# Patient Record
Sex: Male | Born: 1935 | Race: White | Hispanic: No | Marital: Married | Smoking: Former smoker
Health system: Southern US, Community
[De-identification: ages and names within clinical notes are randomized; demographics above are authoritative.]

## PROBLEM LIST (undated history)

## (undated) DIAGNOSIS — I10 Essential (primary) hypertension: Secondary | ICD-10-CM

## (undated) DIAGNOSIS — I4891 Unspecified atrial fibrillation: Secondary | ICD-10-CM

## (undated) DIAGNOSIS — M1990 Unspecified osteoarthritis, unspecified site: Secondary | ICD-10-CM

## (undated) DIAGNOSIS — K635 Polyp of colon: Secondary | ICD-10-CM

## (undated) DIAGNOSIS — K219 Gastro-esophageal reflux disease without esophagitis: Secondary | ICD-10-CM

## (undated) DIAGNOSIS — I6529 Occlusion and stenosis of unspecified carotid artery: Secondary | ICD-10-CM

## (undated) DIAGNOSIS — N200 Calculus of kidney: Secondary | ICD-10-CM

## (undated) DIAGNOSIS — E785 Hyperlipidemia, unspecified: Secondary | ICD-10-CM

## (undated) DIAGNOSIS — K5733 Diverticulitis of large intestine without perforation or abscess with bleeding: Secondary | ICD-10-CM

## (undated) HISTORY — DX: Diverticulitis of large intestine without perforation or abscess with bleeding: K57.33

## (undated) HISTORY — PX: HERNIA REPAIR: SHX51

## (undated) HISTORY — PX: SHOULDER SURGERY: SHX246

## (undated) HISTORY — DX: Unspecified osteoarthritis, unspecified site: M19.90

## (undated) HISTORY — DX: Essential (primary) hypertension: I10

## (undated) HISTORY — PX: KIDNEY STONE SURGERY: SHX686

## (undated) HISTORY — DX: Calculus of kidney: N20.0

## (undated) HISTORY — PX: CAROTID ENDARTERECTOMY: SUR193

## (undated) HISTORY — DX: Polyp of colon: K63.5

## (undated) HISTORY — PX: APPENDECTOMY: SHX54

## (undated) HISTORY — PX: FETAL SURGERY FOR CONGENITAL HERNIA: SHX1618

## (undated) HISTORY — DX: Occlusion and stenosis of unspecified carotid artery: I65.29

## (undated) HISTORY — DX: Gastro-esophageal reflux disease without esophagitis: K21.9

## (undated) HISTORY — DX: Hyperlipidemia, unspecified: E78.5

## (undated) HISTORY — DX: Unspecified atrial fibrillation: I48.91

## (undated) HISTORY — PX: PACEMAKER INSERTION: SHX728

## (undated) HISTORY — PX: CATARACT EXTRACTION: SUR2

---

## 1997-05-24 ENCOUNTER — Encounter: Payer: Self-pay | Admitting: Gastroenterology

## 2001-01-03 ENCOUNTER — Encounter: Payer: Self-pay | Admitting: Gastroenterology

## 2004-12-28 ENCOUNTER — Encounter: Payer: Self-pay | Admitting: Gastroenterology

## 2005-12-24 HISTORY — PX: OTHER SURGICAL HISTORY: SHX169

## 2007-01-22 ENCOUNTER — Inpatient Hospital Stay (HOSPITAL_COMMUNITY): Admission: EM | Admit: 2007-01-22 | Discharge: 2007-01-25 | Payer: Self-pay | Admitting: Emergency Medicine

## 2007-01-22 ENCOUNTER — Encounter (INDEPENDENT_AMBULATORY_CARE_PROVIDER_SITE_OTHER): Payer: Self-pay | Admitting: *Deleted

## 2007-01-25 ENCOUNTER — Encounter (INDEPENDENT_AMBULATORY_CARE_PROVIDER_SITE_OTHER): Payer: Self-pay | Admitting: *Deleted

## 2007-01-27 ENCOUNTER — Ambulatory Visit: Payer: Self-pay | Admitting: Gastroenterology

## 2007-02-13 ENCOUNTER — Ambulatory Visit: Payer: Self-pay | Admitting: Cardiovascular Disease

## 2007-03-03 ENCOUNTER — Ambulatory Visit: Payer: Self-pay | Admitting: Internal Medicine

## 2007-03-03 LAB — CONVERTED CEMR LAB
BUN: 22 mg/dL (ref 6–23)
Basophils Absolute: 0 10*3/uL (ref 0.0–0.1)
Calcium: 8.9 mg/dL (ref 8.4–10.5)
Chloride: 109 meq/L (ref 96–112)
Creatinine, Ser: 1.4 mg/dL (ref 0.4–1.5)
Eosinophils Absolute: 0.1 10*3/uL (ref 0.0–0.6)
GFR calc non Af Amer: 52 mL/min
HCT: 35.1 % — ABNORMAL LOW (ref 39.0–52.0)
INR: 0.9 (ref 0.9–2.0)
MCHC: 33.7 g/dL (ref 30.0–36.0)
MCV: 97.8 fL (ref 78.0–100.0)
Monocytes Relative: 12.4 % — ABNORMAL HIGH (ref 3.0–11.0)
Platelets: 180 10*3/uL (ref 150–400)
RBC: 3.59 M/uL — ABNORMAL LOW (ref 4.22–5.81)
RDW: 13.4 % (ref 11.5–14.6)
Sodium: 143 meq/L (ref 135–145)
aPTT: 33 s (ref 26.5–36.5)

## 2007-03-12 ENCOUNTER — Ambulatory Visit: Payer: Self-pay | Admitting: Gastroenterology

## 2007-03-12 LAB — CONVERTED CEMR LAB: INR: 3.2 — ABNORMAL HIGH (ref 0.9–2.0)

## 2007-04-15 ENCOUNTER — Ambulatory Visit: Payer: Self-pay | Admitting: Internal Medicine

## 2007-04-15 ENCOUNTER — Ambulatory Visit: Payer: Self-pay | Admitting: Cardiology

## 2007-04-15 LAB — CONVERTED CEMR LAB
BUN: 15 mg/dL (ref 6–23)
Basophils Absolute: 0.1 10*3/uL (ref 0.0–0.1)
Basophils Relative: 1.3 % — ABNORMAL HIGH (ref 0.0–1.0)
Calcium: 8.8 mg/dL (ref 8.4–10.5)
Creatinine, Ser: 1.3 mg/dL (ref 0.4–1.5)
GFR calc Af Amer: 68 mL/min
Hemoglobin: 12.2 g/dL — ABNORMAL LOW (ref 13.0–17.0)
INR: 2.5 — ABNORMAL HIGH (ref 0.9–2.0)
MCHC: 34.8 g/dL (ref 30.0–36.0)
Monocytes Absolute: 0.5 10*3/uL (ref 0.2–0.7)
Monocytes Relative: 10.5 % (ref 3.0–11.0)
Neutro Abs: 3.1 10*3/uL (ref 1.4–7.7)
Platelets: 167 10*3/uL (ref 150–400)
Potassium: 4.1 meq/L (ref 3.5–5.1)
Prothrombin Time: 19.9 s — ABNORMAL HIGH (ref 10.0–14.0)
RDW: 12.8 % (ref 11.5–14.6)
aPTT: 36.6 s — ABNORMAL HIGH (ref 26.5–36.5)

## 2007-04-17 ENCOUNTER — Ambulatory Visit: Payer: Self-pay | Admitting: Internal Medicine

## 2007-04-17 ENCOUNTER — Inpatient Hospital Stay (HOSPITAL_COMMUNITY): Admission: RE | Admit: 2007-04-17 | Discharge: 2007-04-18 | Payer: Self-pay | Admitting: Internal Medicine

## 2007-04-24 ENCOUNTER — Ambulatory Visit: Payer: Self-pay | Admitting: Cardiovascular Disease

## 2007-04-29 ENCOUNTER — Ambulatory Visit: Payer: Self-pay | Admitting: Internal Medicine

## 2007-04-30 ENCOUNTER — Ambulatory Visit: Payer: Self-pay | Admitting: *Deleted

## 2007-05-05 ENCOUNTER — Ambulatory Visit: Payer: Self-pay | Admitting: Cardiovascular Disease

## 2007-05-07 ENCOUNTER — Ambulatory Visit: Payer: Self-pay | Admitting: Internal Medicine

## 2007-06-02 ENCOUNTER — Ambulatory Visit: Payer: Self-pay | Admitting: Internal Medicine

## 2007-06-19 ENCOUNTER — Ambulatory Visit: Payer: Self-pay | Admitting: Cardiology

## 2007-06-19 ENCOUNTER — Ambulatory Visit: Payer: Self-pay | Admitting: Internal Medicine

## 2007-06-30 ENCOUNTER — Ambulatory Visit: Payer: Self-pay | Admitting: Cardiovascular Disease

## 2007-08-15 ENCOUNTER — Ambulatory Visit: Payer: Self-pay | Admitting: Cardiovascular Disease

## 2008-04-07 ENCOUNTER — Encounter: Admission: RE | Admit: 2008-04-07 | Discharge: 2008-04-07 | Payer: Self-pay | Admitting: Neurosurgery

## 2008-04-12 ENCOUNTER — Ambulatory Visit: Payer: Self-pay | Admitting: Cardiovascular Disease

## 2008-04-12 ENCOUNTER — Inpatient Hospital Stay (HOSPITAL_COMMUNITY): Admission: EM | Admit: 2008-04-12 | Discharge: 2008-04-21 | Payer: Self-pay | Admitting: Emergency Medicine

## 2008-04-20 ENCOUNTER — Encounter (INDEPENDENT_AMBULATORY_CARE_PROVIDER_SITE_OTHER): Payer: Self-pay | Admitting: Interventional Radiology

## 2008-05-04 ENCOUNTER — Encounter: Payer: Self-pay | Admitting: Interventional Radiology

## 2008-05-05 ENCOUNTER — Encounter: Admission: RE | Admit: 2008-05-05 | Discharge: 2008-05-05 | Payer: Self-pay | Admitting: Neurosurgery

## 2008-05-10 ENCOUNTER — Ambulatory Visit: Payer: Self-pay | Admitting: Internal Medicine

## 2008-05-18 ENCOUNTER — Encounter: Admission: RE | Admit: 2008-05-18 | Discharge: 2008-05-18 | Payer: Self-pay | Admitting: Neurosurgery

## 2008-05-27 ENCOUNTER — Ambulatory Visit (HOSPITAL_COMMUNITY): Admission: RE | Admit: 2008-05-27 | Discharge: 2008-05-27 | Payer: Self-pay | Admitting: Neurosurgery

## 2008-05-27 ENCOUNTER — Encounter (INDEPENDENT_AMBULATORY_CARE_PROVIDER_SITE_OTHER): Payer: Self-pay | Admitting: Interventional Radiology

## 2008-06-10 ENCOUNTER — Encounter: Admission: RE | Admit: 2008-06-10 | Discharge: 2008-06-10 | Payer: Self-pay | Admitting: Neurosurgery

## 2008-09-02 IMAGING — CR DG THORACIC SPINE 2V
3 series · 3 of 3 positions shown · non-contrast
Comparison: CT T spine of 04/17/2008

CLINICAL DATA: Kyphoplasty 04/25/2008, still having midline
thoracic pain

THORACIC SPINE - 2 VIEW

[t t-spine a.p.]
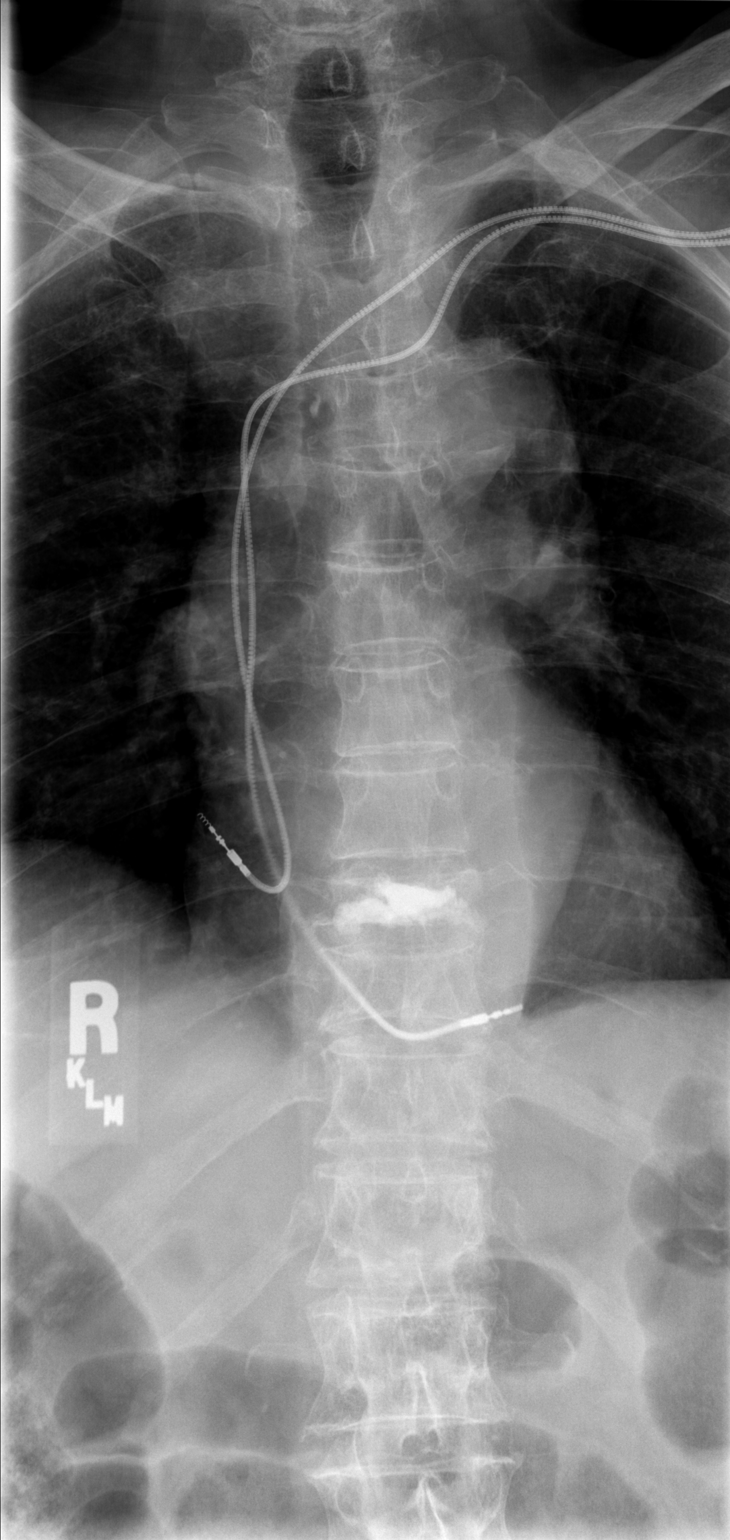

[t t-spine lat]
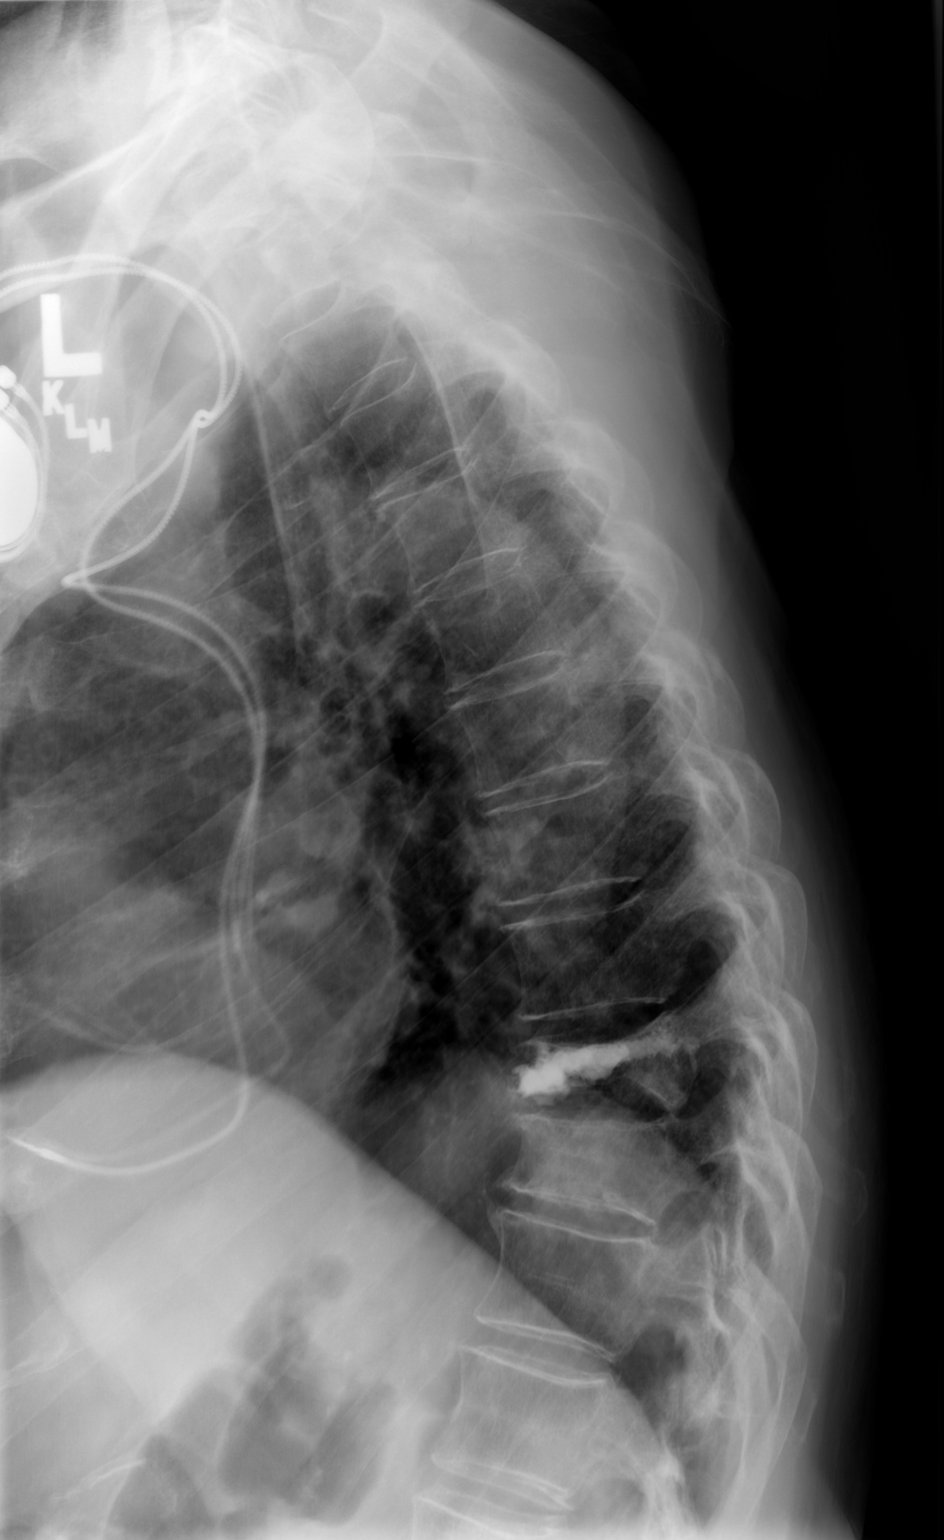

[t swimmers]
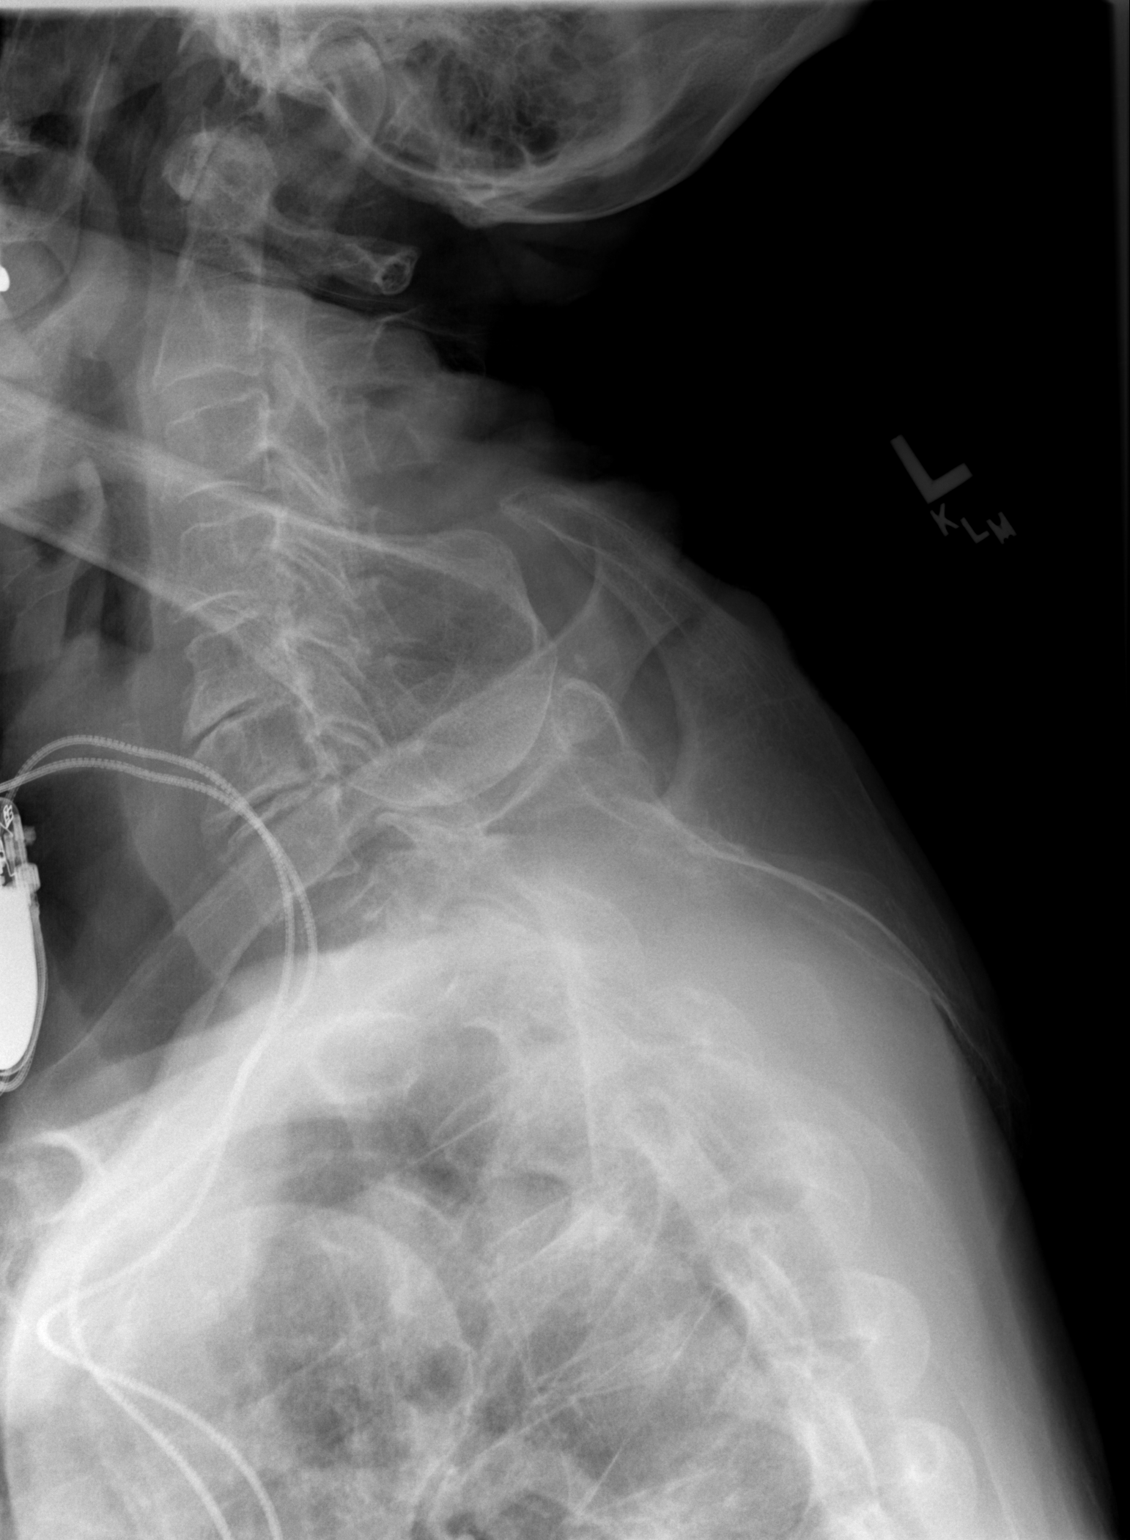

[3 of 3 positions shown; findings below may reference images not displayed]

FINDINGS: Kyphoplasty at the level of T9 is again noted.  The
degree of compression of T9 does not appear to have changed
significantly with slight kyphosis at that level.   There appears
to be very slight compression of T10 vertebral body which may be a
new finding.  No other new compression deformity is seen.
Degenerative disc disease is noted at C5-6 and C6-7 levels.
IMPRESSION: 1.  No change in degree of compression of T9 with interval
vertebroplasty noted.
2.  Very mild compression of T10 which may be a new finding
compared to the CT of 04/17/2008.

## 2008-09-03 ENCOUNTER — Ambulatory Visit: Payer: Self-pay | Admitting: Internal Medicine

## 2008-09-28 ENCOUNTER — Encounter: Admission: RE | Admit: 2008-09-28 | Discharge: 2008-09-28 | Payer: Self-pay | Admitting: Neurosurgery

## 2008-12-08 ENCOUNTER — Encounter: Admission: RE | Admit: 2008-12-08 | Discharge: 2008-12-23 | Payer: Self-pay | Admitting: Orthopedic Surgery

## 2008-12-28 ENCOUNTER — Encounter: Admission: RE | Admit: 2008-12-28 | Discharge: 2009-03-28 | Payer: Self-pay | Admitting: Orthopedic Surgery

## 2009-02-23 ENCOUNTER — Encounter: Admission: RE | Admit: 2009-02-23 | Discharge: 2009-02-23 | Payer: Self-pay | Admitting: Orthopaedic Surgery

## 2009-03-08 ENCOUNTER — Ambulatory Visit: Payer: Self-pay | Admitting: Vascular Surgery

## 2009-03-30 ENCOUNTER — Inpatient Hospital Stay (HOSPITAL_COMMUNITY): Admission: RE | Admit: 2009-03-30 | Discharge: 2009-03-31 | Payer: Self-pay | Admitting: Vascular Surgery

## 2009-03-30 ENCOUNTER — Ambulatory Visit: Payer: Self-pay | Admitting: Vascular Surgery

## 2009-03-30 ENCOUNTER — Encounter: Payer: Self-pay | Admitting: Vascular Surgery

## 2009-04-01 ENCOUNTER — Encounter (INDEPENDENT_AMBULATORY_CARE_PROVIDER_SITE_OTHER): Payer: Self-pay

## 2009-04-09 DIAGNOSIS — I714 Abdominal aortic aneurysm, without rupture, unspecified: Secondary | ICD-10-CM | POA: Insufficient documentation

## 2009-04-09 DIAGNOSIS — I6529 Occlusion and stenosis of unspecified carotid artery: Secondary | ICD-10-CM

## 2009-04-09 DIAGNOSIS — G589 Mononeuropathy, unspecified: Secondary | ICD-10-CM | POA: Insufficient documentation

## 2009-04-09 DIAGNOSIS — I4891 Unspecified atrial fibrillation: Secondary | ICD-10-CM | POA: Insufficient documentation

## 2009-04-09 DIAGNOSIS — K279 Peptic ulcer, site unspecified, unspecified as acute or chronic, without hemorrhage or perforation: Secondary | ICD-10-CM | POA: Insufficient documentation

## 2009-04-09 DIAGNOSIS — E7800 Pure hypercholesterolemia, unspecified: Secondary | ICD-10-CM

## 2009-04-09 DIAGNOSIS — I499 Cardiac arrhythmia, unspecified: Secondary | ICD-10-CM | POA: Insufficient documentation

## 2009-04-11 ENCOUNTER — Encounter: Payer: Self-pay | Admitting: Internal Medicine

## 2009-04-11 ENCOUNTER — Ambulatory Visit: Payer: Self-pay | Admitting: Internal Medicine

## 2009-04-11 DIAGNOSIS — Z950 Presence of cardiac pacemaker: Secondary | ICD-10-CM

## 2009-04-19 ENCOUNTER — Encounter: Admission: RE | Admit: 2009-04-19 | Discharge: 2009-04-19 | Payer: Self-pay | Admitting: Neurosurgery

## 2009-04-26 ENCOUNTER — Ambulatory Visit: Payer: Self-pay | Admitting: Vascular Surgery

## 2009-04-27 ENCOUNTER — Inpatient Hospital Stay (HOSPITAL_COMMUNITY): Admission: AD | Admit: 2009-04-27 | Discharge: 2009-04-30 | Payer: Self-pay | Admitting: Gastroenterology

## 2009-04-27 ENCOUNTER — Telehealth: Payer: Self-pay | Admitting: Gastroenterology

## 2009-04-27 ENCOUNTER — Ambulatory Visit: Payer: Self-pay | Admitting: Gastroenterology

## 2009-04-27 DIAGNOSIS — K5731 Diverticulosis of large intestine without perforation or abscess with bleeding: Secondary | ICD-10-CM

## 2009-04-27 DIAGNOSIS — K921 Melena: Secondary | ICD-10-CM

## 2009-04-27 LAB — CONVERTED CEMR LAB
Basophils Relative: 1.5 % (ref 0.0–3.0)
Eosinophils Relative: 4.5 % (ref 0.0–5.0)
Lymphocytes Relative: 19.6 % (ref 12.0–46.0)
Monocytes Relative: 12.4 % — ABNORMAL HIGH (ref 3.0–12.0)
Neutrophils Relative %: 62 % (ref 43.0–77.0)
Platelets: 159 10*3/uL (ref 150.0–400.0)
RBC: 2.78 M/uL — ABNORMAL LOW (ref 4.22–5.81)
WBC: 5.7 10*3/uL (ref 4.5–10.5)

## 2009-05-02 ENCOUNTER — Telehealth: Payer: Self-pay | Admitting: Gastroenterology

## 2009-05-04 ENCOUNTER — Ambulatory Visit: Payer: Self-pay | Admitting: Gastroenterology

## 2009-05-04 DIAGNOSIS — D649 Anemia, unspecified: Secondary | ICD-10-CM

## 2009-05-04 DIAGNOSIS — K5730 Diverticulosis of large intestine without perforation or abscess without bleeding: Secondary | ICD-10-CM | POA: Insufficient documentation

## 2009-05-04 DIAGNOSIS — D539 Nutritional anemia, unspecified: Secondary | ICD-10-CM | POA: Insufficient documentation

## 2009-05-04 DIAGNOSIS — K922 Gastrointestinal hemorrhage, unspecified: Secondary | ICD-10-CM | POA: Insufficient documentation

## 2009-05-04 DIAGNOSIS — D62 Acute posthemorrhagic anemia: Secondary | ICD-10-CM | POA: Insufficient documentation

## 2009-05-04 LAB — CONVERTED CEMR LAB
Basophils Relative: 1.1 % (ref 0.0–3.0)
Eosinophils Relative: 4.1 % (ref 0.0–5.0)
HCT: 27.8 % — ABNORMAL LOW (ref 39.0–52.0)
Hemoglobin: 9.2 g/dL — ABNORMAL LOW (ref 13.0–17.0)
Lymphs Abs: 0.9 10*3/uL (ref 0.7–4.0)
MCHC: 33.1 g/dL (ref 30.0–36.0)
MCV: 103.7 fL — ABNORMAL HIGH (ref 78.0–100.0)
Monocytes Absolute: 0.6 10*3/uL (ref 0.1–1.0)
Neutro Abs: 5 10*3/uL (ref 1.4–7.7)
Neutrophils Relative %: 72.3 % (ref 43.0–77.0)
RBC: 2.68 M/uL — ABNORMAL LOW (ref 4.22–5.81)
WBC: 6.9 10*3/uL (ref 4.5–10.5)

## 2009-05-05 LAB — CONVERTED CEMR LAB
Folate: >20 ng/mL
Saturation Ratios: 25.4 % (ref 20.0–50.0)
Vitamin B-12: 862 pg/mL (ref 211–911)

## 2009-05-16 ENCOUNTER — Encounter: Admission: RE | Admit: 2009-05-16 | Discharge: 2009-05-16 | Payer: Self-pay | Admitting: Neurosurgery

## 2009-05-18 ENCOUNTER — Encounter: Payer: Self-pay | Admitting: Interventional Radiology

## 2009-05-18 ENCOUNTER — Ambulatory Visit: Payer: Self-pay | Admitting: Physician Assistant

## 2009-05-20 LAB — CONVERTED CEMR LAB
Basophils Absolute: 0 10*3/uL (ref 0.0–0.1)
Eosinophils Relative: 7.3 % — ABNORMAL HIGH (ref 0.0–5.0)
Hemoglobin: 9.4 g/dL — ABNORMAL LOW (ref 13.0–17.0)
Lymphocytes Relative: 16.3 % (ref 12.0–46.0)
Monocytes Relative: 11.1 % (ref 3.0–12.0)
Neutro Abs: 3.9 10*3/uL (ref 1.4–7.7)
RDW: 13.3 % (ref 11.5–14.6)
WBC: 5.8 10*3/uL (ref 4.5–10.5)

## 2009-05-27 ENCOUNTER — Ambulatory Visit (HOSPITAL_COMMUNITY): Admission: RE | Admit: 2009-05-27 | Discharge: 2009-05-28 | Payer: Self-pay | Admitting: Neurosurgery

## 2009-05-27 ENCOUNTER — Encounter (INDEPENDENT_AMBULATORY_CARE_PROVIDER_SITE_OTHER): Payer: Self-pay | Admitting: Interventional Radiology

## 2009-06-03 ENCOUNTER — Ambulatory Visit (HOSPITAL_COMMUNITY): Admission: RE | Admit: 2009-06-03 | Discharge: 2009-06-03 | Payer: Self-pay | Admitting: Interventional Radiology

## 2009-06-04 ENCOUNTER — Encounter: Payer: Self-pay | Admitting: Emergency Medicine

## 2009-06-04 ENCOUNTER — Ambulatory Visit: Payer: Self-pay | Admitting: Diagnostic Radiology

## 2009-06-05 ENCOUNTER — Inpatient Hospital Stay (HOSPITAL_COMMUNITY): Admission: EM | Admit: 2009-06-05 | Discharge: 2009-06-10 | Payer: Self-pay | Admitting: Internal Medicine

## 2009-06-09 ENCOUNTER — Encounter (INDEPENDENT_AMBULATORY_CARE_PROVIDER_SITE_OTHER): Payer: Self-pay | Admitting: *Deleted

## 2009-06-10 ENCOUNTER — Encounter (INDEPENDENT_AMBULATORY_CARE_PROVIDER_SITE_OTHER): Payer: Self-pay | Admitting: Internal Medicine

## 2009-06-10 ENCOUNTER — Ambulatory Visit: Payer: Self-pay | Admitting: Vascular Surgery

## 2009-06-13 ENCOUNTER — Telehealth: Payer: Self-pay | Admitting: Physician Assistant

## 2009-07-08 ENCOUNTER — Encounter: Payer: Self-pay | Admitting: Interventional Radiology

## 2009-07-18 ENCOUNTER — Encounter (HOSPITAL_COMMUNITY): Admission: RE | Admit: 2009-07-18 | Discharge: 2009-09-26 | Payer: Self-pay | Admitting: Neurosurgery

## 2009-08-16 ENCOUNTER — Encounter: Admission: RE | Admit: 2009-08-16 | Discharge: 2009-08-16 | Payer: Self-pay | Admitting: Neurosurgery

## 2009-08-25 ENCOUNTER — Encounter: Admission: RE | Admit: 2009-08-25 | Discharge: 2009-08-25 | Payer: Self-pay | Admitting: Neurosurgery

## 2009-09-18 IMAGING — CT CT L SPINE W/O CM
2 of 4 series · 5 of 14 positions shown, 6 images · non-contrast
Comparison: CT abdomen and pelvis 05/16/2009.

CLINICAL DATA: Persistent low back pain.  Prior kyphoplasty.

CT LUMBAR SPINE WITHOUT CONTRAST
TECHNIQUE: Multidetector CT imaging of the lumbar spine was
performed without intravenous contrast administration. Multiplanar
CT image reconstructions were also generated.

[Series 2: l-spine · axial · 0.27mm/px · z∈[-283,-198]mm · 3 of 70 slices shown, 4 images]
[im 18/70  soft-tissue]
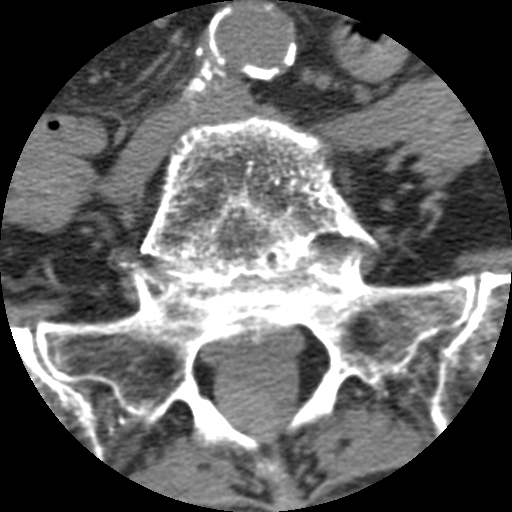
[im 18/70  bone]
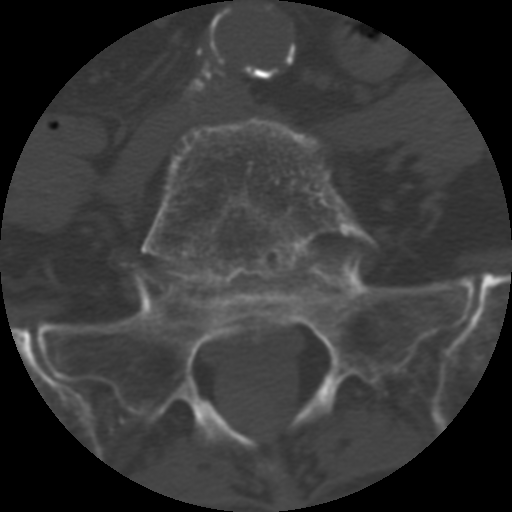
[im 35/70  bone]
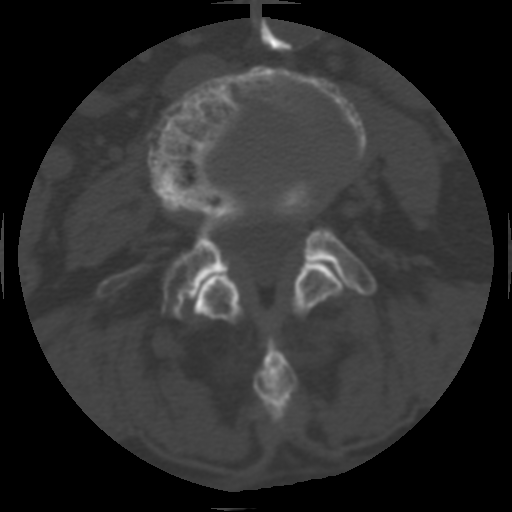
[im 52/70  bone]
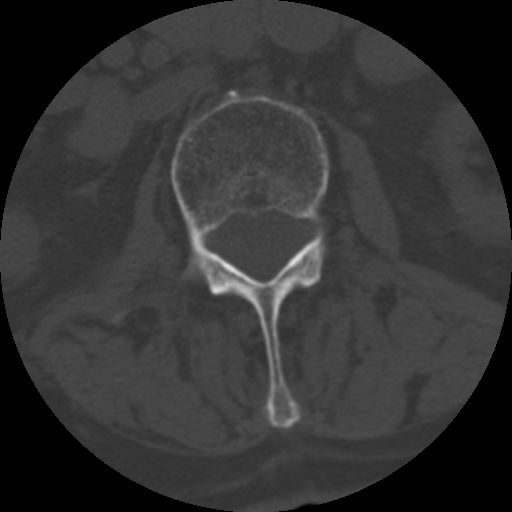

[Series 3: recon 2: l-spine · axial · 0.27mm/px · z∈[-268,-210]mm · 2 of 70 slices shown]
[im 24/70  bone]
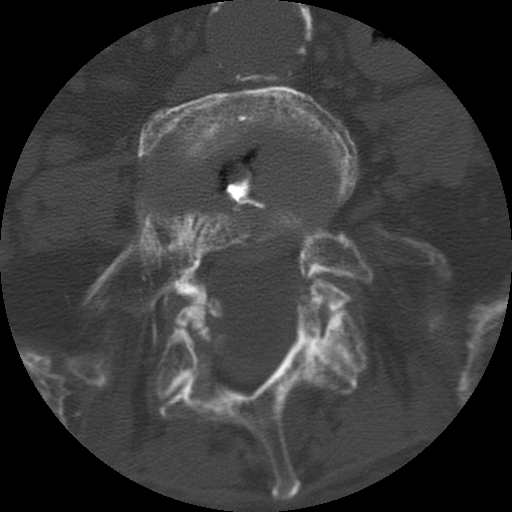
[im 47/70  bone]
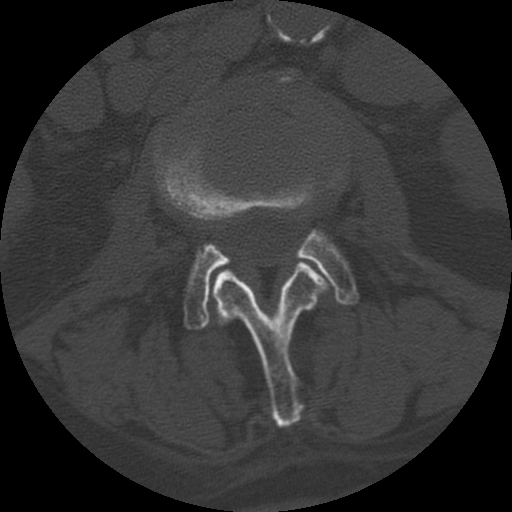

[5 of 14 positions shown; findings below may reference images not displayed]

FINDINGS: The patient has undergone interval kyphoplasty L3.
Prior L4 kyphoplasty is again noted with some cement extruded into
the L4-5 disc space which is unchanged in appearance.  There is no
new fracture.  1.1 cm of anterolisthesis of L5 on S1 due to
bilateral L5 pars interarticularis defects again noted.

Mild disc bulging is noted at to L2-3 and L3-4 but the central
canal and foramina appear open.  Bilateral foraminal narrowing at
L5-S1, worse on the left, due to anterolisthesis is unchanged.
Imaged intra-abdominal contents are unremarkable.
IMPRESSION: 1.  Negative for new compression fracture deformity.
2.  Interval kyphoplasty at L3 without complicating feature.  Prior
L4 kyphoplasty again noted.
3.  Unchanged anterolisthesis of L5 on S1 due to bilateral L5 pars
intra-articularis defects results in left worse than right
foraminal narrowing.

## 2009-09-30 ENCOUNTER — Emergency Department (HOSPITAL_COMMUNITY): Admission: EM | Admit: 2009-09-30 | Discharge: 2009-10-01 | Payer: Self-pay | Admitting: Emergency Medicine

## 2009-11-08 ENCOUNTER — Encounter: Payer: Self-pay | Admitting: Gastroenterology

## 2009-11-15 ENCOUNTER — Telehealth: Payer: Self-pay | Admitting: Gastroenterology

## 2009-12-12 ENCOUNTER — Ambulatory Visit: Payer: Self-pay | Admitting: Gastroenterology

## 2009-12-27 ENCOUNTER — Ambulatory Visit: Payer: Self-pay | Admitting: Vascular Surgery

## 2010-01-16 ENCOUNTER — Telehealth: Payer: Self-pay | Admitting: Internal Medicine

## 2010-01-17 ENCOUNTER — Ambulatory Visit: Payer: Self-pay | Admitting: Gastroenterology

## 2010-03-29 ENCOUNTER — Ambulatory Visit: Payer: Self-pay | Admitting: Internal Medicine

## 2010-04-13 ENCOUNTER — Telehealth: Payer: Self-pay | Admitting: Internal Medicine

## 2010-08-21 ENCOUNTER — Encounter: Admission: RE | Admit: 2010-08-21 | Discharge: 2010-08-21 | Payer: Self-pay | Admitting: Neurosurgery

## 2010-08-26 ENCOUNTER — Inpatient Hospital Stay (HOSPITAL_COMMUNITY): Admission: EM | Admit: 2010-08-26 | Discharge: 2010-08-27 | Payer: Self-pay | Admitting: Emergency Medicine

## 2010-08-26 ENCOUNTER — Emergency Department (HOSPITAL_COMMUNITY): Admission: EM | Admit: 2010-08-26 | Discharge: 2010-08-26 | Payer: Self-pay | Admitting: Emergency Medicine

## 2010-10-16 ENCOUNTER — Ambulatory Visit: Payer: Self-pay | Admitting: Vascular Surgery

## 2011-01-14 ENCOUNTER — Encounter: Payer: Self-pay | Admitting: Interventional Radiology

## 2011-01-23 NOTE — Progress Notes (Signed)
Summary: pt needs Rx sent to [facility] in [place]   Phone Note Refill Request Call back at [facility] Phone [phone] Message from:  Patient  Patient needs all his meds faxed to [facility] hosp. in [place] fax# [phone]  Dr. Marin Olp is the MD he see's there  Initial call taken by: Omer Jack,  January 16, 2010 11:14 AM Caller: Patient    Prescriptions: SIMVASTATIN 40 MG TABS (SIMVASTATIN) Take 1/2 tab daily  #45 x 3   Entered by:   Laurance Flatten CMA   Authorized by:   Laren Boom, MD, [facility]   Signed by:   Laurance Flatten CMA on 01/18/2010   Method used:   Printed then faxed to ...       [facility]  [address] [phone]* (retail)       [address]       [place], [place]  [address]       Ph: [phone]       Fax: [phone]   RxID:   [id number] ATENOLOL 50 MG TABS (ATENOLOL) Take one tablet by mouth daily  #90 x 3   Entered by:   Laurance Flatten CMA   Authorized by:   Laren Boom, MD, [facility]   Signed by:   Laurance Flatten CMA on 01/18/2010   Method used:   Printed then faxed to ...       [facility]  [address] [phone]* (retail)       [address]       [place], [place]  [address]       Ph: [phone]       Fax: [phone]   RxID:   [id number] AMIODARONE HCL 200 MG TABS (AMIODARONE HCL) Take 1 tablet by mouth bid  #180 x 3   Entered by:   Laurance Flatten CMA   Authorized by:   Laren Boom, MD, [facility]   Signed by:   Laurance Flatten CMA on 01/18/2010   Method used:   Printed then faxed to ...       [facility]  [address] [phone]* (retail)       [address]       [place], [place]  [address]       Ph: [phone]       Fax: [phone]   RxID:   [id number]

## 2011-01-23 NOTE — Assessment & Plan Note (Signed)
  Review of gastrointestinal problems: 1. Lower GI, painless rectal bleeding, presumed from diverticular origin May, 2010.  Was admitted, did not require blood transfusion. 2. colonoscopy, elsewhere, 2006: extensive diverticulosis otherwise normal    History of Present Illness Visit Type: Follow-up Visit Primary GI MD: Rob Bunting MD Primary Provider: Merlene Laughter MD Chief Complaint: 5-6 week f/u History of Present Illness:     very pleasant 77 year old man whom I last saw about one month ago. At that point he started taking the fiber supplement, then stopped, then restarted, then stopped and restarted again.    He noticed no difference on the fiber supplement.  Had to rescue with stool softners.  has tried miralax in the past, seemed to help.             Current Medications (verified): 1)  Amiodarone Hcl 200 Mg Tabs (Amiodarone Hcl) .... Take 1 Tablet By Mouth Bid 2)  Oxybutynin Chloride 5 Mg Tabs (Oxybutynin Chloride) .Marland Kitchen.. 1in The Am and 1/2 Qpm 3)  Atenolol 50 Mg Tabs (Atenolol) .... Take One Tablet By Mouth Daily 4)  Gabapentin 300 Mg Caps (Gabapentin) .Marland Kitchen.. 1 By Mouth Three Times A Day 5)  Simvastatin 40 Mg Tabs (Simvastatin) .... Take 1/2 Tab Daily 6)  Aspirin 81 Mg Tbec (Aspirin) .... Take One Tablet By Mouth Daily 7)  Ibuprofen 200 Mg Tabs (Ibuprofen) .... As Needed 8)  Mucinex 600 Mg Xr12h-Tab (Guaifenesin) .... Two Times A Day 9)  Cvs Omeprazole 20 Mg Tbec (Omeprazole) .Marland Kitchen.. 1 By Mouth Once Daily 10)  Mvi .... Daily 11)  Probiotics .... Daily 12)  Miacalcin 200 Unit/ml Soln (Calcitonin (Salmon)) .... One Spray Daily To One Nostril 13)  Vitamin C .... Daily 14)  Calcium 600 1500 Mg Tabs (Calcium Carbonate) .... Once Daily 15)  Vitamin D 400 Unit Tabs (Cholecalciferol) .... Once Daily 16)  Ultram 50 Mg Tabs (Tramadol Hcl) .... Every 6 Hours As Needed For Pain 17)  Lexapro 10 Mg Tabs (Escitalopram Oxalate) .... Take 1/2 Tablet Once Daily 18)  Tylenol 325 Mg Tabs  (Acetaminophen) .... Three Times A Day  Allergies (verified): 1)  ! Pcn 2)  ! * Mycins  Vital Signs:  Patient profile:   77 year old male Height:      70 inches Weight:      112.50 pounds  Vitals Entered By: Chales Abrahams CMA Duncan Dull) (January 17, 2010 3:24 PM)  Physical Exam  Additional Exam:  Constitutional: generally well appearing Psychiatric: alert and oriented times 3 Abdomen: soft, non-tender, non-distended, normal bowel sounds    Impression & Recommendations:  Problem # 1:  constipation fiber supplements have not helped and so he will try MiraLax 2 capsules a day and he will return to see me in 6-8 weeks and sooner if needed.  Patient Instructions: 1)  Stop the fiber supplement.  This doesn't seem to be helping. 2)  Start taking miralax 2 capfuls a day, Ok to decrease if that causes too loose of stool. 3)  Return to see Dr. Christella Hartigan in 7-8 weeks, sooner if needed.  Call if troubles. 4)  A copy of this information will be sent to Dr. Pete Glatter. 5)  The medication list was reviewed and reconciled.  All changed / newly prescribed medications were explained.  A complete medication list was provided to the patient / caregiver.

## 2011-01-23 NOTE — Assessment & Plan Note (Signed)
Summary: 1 YR F/U  Medications Added [facility] OMEPRAZOLE 20 MG TBEC (OMEPRAZOLE) 2 by mouth once daily        Visit Type:  Follow-up Primary Provider:  Merlene Laughter MD   History of Present Illness: [name] returns today for follow-up.  He is a pleasant elderly man with PAF, symptomatic bradycardia and a h/o GI bleeding on coumadin.  He denies c/p or sob.  He has minimal if any palpitations.  He denies f/c/anorexia or skin problems.  Current Medications (verified): 1)  Amiodarone Hcl 200 Mg Tabs (Amiodarone Hcl) .... Take 1 Tablet By Mouth Bid 2)  Oxybutynin Chloride 5 Mg Tabs (Oxybutynin Chloride) .Marland Kitchen.. 1in The Am and 1/2 Qpm 3)  Atenolol 50 Mg Tabs (Atenolol) .... Take One Tablet By Mouth Daily 4)  Gabapentin 300 Mg Caps (Gabapentin) .Marland Kitchen.. 1 By Mouth Three Times A Day 5)  Simvastatin 40 Mg Tabs (Simvastatin) .... Take 1/2 Tab Daily 6)  Aspirin 81 Mg Tbec (Aspirin) .... Take One Tablet By Mouth Daily 7)  Mucinex 600 Mg Xr12h-Tab (Guaifenesin) .... Two Times A Day 8)  Cvs Omeprazole 20 Mg Tbec (Omeprazole) .... 2 By Mouth Once Daily 9)  Mvi .... Daily 10)  Probiotics .... Daily 11)  Miacalcin 200 Unit/ml Soln (Calcitonin (Salmon)) .... One Spray Daily To One Nostril 12)  Vitamin C .... Daily 13)  Calcium 600 1500 Mg Tabs (Calcium Carbonate) .... Once Daily 14)  Vitamin D 400 Unit Tabs (Cholecalciferol) .... Once Daily 15)  Lexapro 10 Mg Tabs (Escitalopram Oxalate) .... Take 1/2 Tablet Once Daily 16)  Tylenol 325 Mg Tabs (Acetaminophen) .... Three Times A Day  Allergies: 1)  ! Pcn 2)  ! * Mycins  Past History:  Past Medical History: Last updated: 05/04/2009 Hyperlipidemia Hypertension ATRIAL FIBRILLATION,S/P PACEMAKER Arthritis GERD RECURRENT DIVERTICULAR BLEEDING CAROTID ARTERY STENOSIS,LEFT  Past Surgical History: Last updated: 04/09/2009 hernial surgery Appendectomy bilateral catracrt surgery shoulder surgery implantation of a Medtronic dual- chamber pacemaker   04/17/2007  Doylene Canning. Ladona Ridgel, MD   Left carotid endarterectomy with Dacron patch angioplasty.  03/30/2009 Quita Skye. Hart Rochester, M.D.     Review of Systems  The patient denies chest pain, syncope, dyspnea on exertion, and peripheral edema.    Vital Signs:  Patient profile:   77 year old male Height:      70 inches Weight:      118 pounds BMI:     16.99 O2 Sat:      98 % Pulse rate:   66 / minute BP sitting:   146 / 62  Vitals Entered By: Laurance Flatten CMA (March 29, 2010 11:57 AM)  Physical Exam  General:  Well developed, well nourished, no acute distress.,VERY THIN Head:  Normocephalic and atraumatic. Eyes:  PERRLA, no icterus. Mouth:  Teeth, gums and palate normal. Oral mucosa normal. Neck:  Supple; no masses or thyromegaly. Chest Wall:  Well healed PPM incision. Lungs:  Clear throughout to auscultation. Heart:  Regular rate and rhythm; no murmurs, rubs,  or bruits. Abdomen:  SOFT,NONTENDER, NO MASS OR HSM,BS+ Msk:  Symmetrical with no gross deformities. Normal posture.,AMBULATEDS WITH A CANE Pulses:  pulses normal in all 4 extremities Extremities:  No clubbing or cyanosis. Neurologic:  Alert and  oriented x4;  grossly normal neurologically.HARD OF HEARING   PPM Specifications Following MD:  Lewayne Bunting, MD     PPM Vendor:  Medtronic     PPM Model Number:  [id number]     PPM Serial Number:  [id number] PPM DOI:  04/17/2007     PPM Implanting MD:  Lewayne Bunting, MD  Lead 1    Location: RA     DOI: 04/17/2007     Model #: 9562     Serial #: [id number]     Status: active Lead 2    Location: RV     DOI: 04/17/2007     Model #: [id number]     Serial #: [id number]     Status: active   Indications:  Tachy-Brady Syndrome;PAF   PPM Follow Up Remote Check?  No Battery Voltage:  2.78 V     Battery Est. Longevity:  7 years     Pacer Dependent:  No       PPM Device Measurements Atrium  Amplitude: 2.0 mV, Impedance: 466 ohms, Threshold: .25 V at .4 msec Right Ventricle  Amplitude: 15 mV,  Impedance: 482 ohms, Threshold: 1.0 V at .4 msec Auto Capture ER/Polarity: 0  Episodes MS Episodes:  8     Percent Mode Switch:  0.1%     Coumadin:  No Ventricular High Rate:  0     Atrial Pacing:  92.1     Ventricular Pacing:  7.9  Parameters Mode:  DDDR     Lower Rate Limit:  60     Upper Rate Limit:  130 Paced AV Delay:  250     Sensed AV Delay:  200 MD Comments:  Agree with above.  ICD Follow Up Remote Check?  No  Impression & Recommendations:  Problem # 1:  ATRIAL FIBRILLATION (ICD-427.31) He is maintaining NSR.  He is on 200 mg of amiodarone daily. He is not on coumadin secondary to GI bleeding. His updated medication list for this problem includes:    Atenolol 50 Mg Tabs (Atenolol) .Marland Kitchen... Take one tablet by mouth daily    Aspirin 81 Mg Tbec (Aspirin) .Marland Kitchen... Take one tablet by mouth daily  Problem # 2:  CARDIAC PACEMAKER IN SITU (ICD-V45.01) His device is working normally.  Will recheck in several months.  Problem # 3:  CAROTID ARTERY STENOSIS (ICD-433.10)  He is asymptomatic. Will follow. His updated medication list for this problem includes:    Aspirin 81 Mg Tbec (Aspirin) .Marland Kitchen... Take one tablet by mouth daily  His updated medication list for this problem includes:    Aspirin 81 Mg Tbec (Aspirin) .Marland Kitchen... Take one tablet by mouth daily

## 2011-01-23 NOTE — Progress Notes (Signed)
Summary: bp readings   Phone Note Call from Patient Call back at Home Phone [phone]   Caller: Spouse Reason for Call: Talk to Nurse, Lab or Test Results Summary of Call: bp readings 4/11 140/66 4/12 136/69 4/13 131/66 4/14 133/60 4/15 118/65 4/16 109/63 4/17 136/77 4/18 138/68 4/21 150/69  he keeps having headaches Initial call taken by: Migdalia Dk,  April 13, 2010 1:37 PM  Follow-up for Phone Call        PT' S WIFE  AWARE WILL FORWARD TO  DR Ladona Ridgel INFORMED WIFE TO CALL PMD RE HEADACHES .WILL CALL BACK AFTER DR Ladona Ridgel REVIEWS VERBALIZED UNDERSTANDING Follow-up by: Scherrie Bateman, LPN,  April 13, 2010 2:06 PM

## 2011-03-08 ENCOUNTER — Encounter (INDEPENDENT_AMBULATORY_CARE_PROVIDER_SITE_OTHER): Payer: Self-pay | Admitting: *Deleted

## 2011-03-08 LAB — BASIC METABOLIC PANEL
BUN: 21 mg/dL (ref 6–23)
BUN: 23 mg/dL (ref 6–23)
CO2: 25 mEq/L (ref 19–32)
Chloride: 105 mEq/L (ref 96–112)
Chloride: 106 mEq/L (ref 96–112)
Creatinine, Ser: 1.33 mg/dL (ref 0.4–1.5)
GFR calc Af Amer: >60 mL/min (ref >60)
Potassium: 3.5 mEq/L (ref 3.5–5.1)

## 2011-03-08 LAB — CBC
HCT: 35.3 % — ABNORMAL LOW (ref 39.0–52.0)
HCT: 38.7 % — ABNORMAL LOW (ref 39.0–52.0)
Hemoglobin: 11.8 g/dL — ABNORMAL LOW (ref 13.0–17.0)
MCHC: 33.4 g/dL (ref 30.0–36.0)
MCV: 102.4 fL — ABNORMAL HIGH (ref 78.0–100.0)
RBC: 3.78 MIL/uL — ABNORMAL LOW (ref 4.22–5.81)
WBC: 7.2 10*3/uL (ref 4.0–10.5)
WBC: 8.5 10*3/uL (ref 4.0–10.5)

## 2011-03-08 LAB — DIFFERENTIAL
Eosinophils Relative: 2 % (ref 0–5)
Lymphocytes Relative: 19 % (ref 12–46)
Lymphs Abs: 1.7 10*3/uL (ref 0.7–4.0)
Monocytes Absolute: 0.8 10*3/uL (ref 0.1–1.0)
Monocytes Relative: 10 % (ref 3–12)

## 2011-03-12 ENCOUNTER — Encounter (INDEPENDENT_AMBULATORY_CARE_PROVIDER_SITE_OTHER): Payer: Medicare Other | Admitting: Internal Medicine

## 2011-03-12 ENCOUNTER — Encounter: Payer: Self-pay | Admitting: Internal Medicine

## 2011-03-12 DIAGNOSIS — I495 Sick sinus syndrome: Secondary | ICD-10-CM

## 2011-03-12 DIAGNOSIS — I5032 Chronic diastolic (congestive) heart failure: Secondary | ICD-10-CM

## 2011-03-12 DIAGNOSIS — I4891 Unspecified atrial fibrillation: Secondary | ICD-10-CM

## 2011-03-12 DIAGNOSIS — E782 Mixed hyperlipidemia: Secondary | ICD-10-CM

## 2011-03-13 NOTE — Letter (Signed)
Summary: Appointment - Reschedule  [facility], Main Office  [address]   [place], [place] [address]   Phone: [phone]  Fax: [phone]     March 08, 2011 MRN: [id number]   [facility] [address] [place], [place]  [address]   Dear Mr. Trickey,   Due to a change in our office schedule, your appointment on 03-29-11  at 1:45               must be changed, we can see you 03-27-11 at 1:45 instead, please call to confirm or reschedule.  It is very important that we reach you to reschedule this appointment. We look forward to participating in your health care needs. Please contact us at the number listed above at your earliest convenience to reschedule this appointment.     Sincerely,  Glass blower/designer

## 2011-03-20 ENCOUNTER — Encounter: Payer: Self-pay | Admitting: Internal Medicine

## 2011-03-22 NOTE — Cardiovascular Report (Signed)
Summary: Office Visit   Office Visit   Imported By: Roderic Ovens 03/16/2011 14:56:31  _____________________________________________________________________  External Attachment:    Type:   Image     Comment:   External Document

## 2011-03-22 NOTE — Assessment & Plan Note (Signed)
Summary: FOLLOW UP - 1 YEAR rs pt per our request/mt pt's wife rs appt/mt  Medications Added AMIODARONE HCL 200 MG TABS (AMIODARONE HCL) Take 1/2  tablet by mouth two times a day MARINOL 2.5 MG CAPS (DRONABINOL) three times a day      Allergies Added:   Visit Type:  follow-up device check Primary Provider:  Merlene Laughter MD  CC:  pt has no complaints today.Marland Kitchen  History of Present Illness: [name] returns today for follow-up.  He is a pleasant elderly man with PAF, symptomatic bradycardia and a h/o GI bleeding on coumadin.  He denies c/p or sob.  He has minimal if any palpitations.  He denies f/c/anorexia or skin problems. The patient has had gradual reduction in his activity. He has had no active bleeding but he has not been on coumadin.  Medications Prior to Update: 1)  Amiodarone Hcl 200 Mg Tabs (Amiodarone Hcl) .... Take 1 Tablet By Mouth Bid 2)  Oxybutynin Chloride 5 Mg Tabs (Oxybutynin Chloride) .Marland Kitchen.. 1in The Am and 1/2 Qpm 3)  Atenolol 50 Mg Tabs (Atenolol) .... Take One Tablet By Mouth Daily 4)  Gabapentin 300 Mg Caps (Gabapentin) .Marland Kitchen.. 1 By Mouth Three Times A Day 5)  Simvastatin 40 Mg Tabs (Simvastatin) .... Take 1/2 Tab Daily 6)  Aspirin 81 Mg Tbec (Aspirin) .... Take One Tablet By Mouth Daily 7)  Mucinex 600 Mg Xr12h-Tab (Guaifenesin) .... Two Times A Day 8)  Cvs Omeprazole 20 Mg Tbec (Omeprazole) .... 2 By Mouth Once Daily 9)  Mvi .... Daily 10)  Probiotics .... Daily 11)  Miacalcin 200 Unit/ml Soln (Calcitonin (Salmon)) .... One Spray Daily To One Nostril 12)  Vitamin C .... Daily 13)  Calcium 600 1500 Mg Tabs (Calcium Carbonate) .... Once Daily 14)  Vitamin D 400 Unit Tabs (Cholecalciferol) .... Once Daily 15)  Lexapro 10 Mg Tabs (Escitalopram Oxalate) .... Take 1/2 Tablet Once Daily 16)  Tylenol 325 Mg Tabs (Acetaminophen) .... Three Times A Day  Current Medications (verified): 1)  Amiodarone Hcl 200 Mg Tabs (Amiodarone Hcl) .... Take 1/2  Tablet By Mouth Two Times  A Day 2)  Atenolol 50 Mg Tabs (Atenolol) .... Take One Tablet By Mouth Daily 3)  Gabapentin 300 Mg Caps (Gabapentin) .Marland Kitchen.. 1 By Mouth Three Times A Day 4)  Aspirin 81 Mg Tbec (Aspirin) .... Take One Tablet By Mouth Daily 5)  Mucinex 600 Mg Xr12h-Tab (Guaifenesin) .... Two Times A Day 6)  Cvs Omeprazole 20 Mg Tbec (Omeprazole) .... 2 By Mouth Once Daily 7)  Mvi .... Daily 8)  Probiotics .... Daily 9)  Miacalcin 200 Unit/ml Soln (Calcitonin (Salmon)) .... One Spray Daily To One Nostril 10)  Vitamin C .... Daily 11)  Calcium 600 1500 Mg Tabs (Calcium Carbonate) .... Once Daily 12)  Vitamin D 400 Unit Tabs (Cholecalciferol) .... Once Daily 13)  Tylenol 325 Mg Tabs (Acetaminophen) .... Three Times A Day 14)  Marinol 2.5 Mg Caps (Dronabinol) .... Three Times A Day  Allergies (verified): 1)  ! Pcn 2)  ! * Mycins  Past History:  Past Medical History: Last updated: 05/04/2009 Hyperlipidemia Hypertension ATRIAL FIBRILLATION,S/P PACEMAKER Arthritis GERD RECURRENT DIVERTICULAR BLEEDING CAROTID ARTERY STENOSIS,LEFT  Past Surgical History: Last updated: 04/09/2009 hernial surgery Appendectomy bilateral catracrt surgery shoulder surgery implantation of a Medtronic dual- chamber pacemaker  04/17/2007  Doylene Canning. Ladona Ridgel, MD   Left carotid endarterectomy with Dacron patch angioplasty.  03/30/2009 Quita Skye. Hart Rochester, M.D.     Family History: Last  updated: 04/09/2009 Positive for diabetes in his mother, coronary artery   disease in his sister, negative for stroke.      Social History: Last updated: 04/09/2009 He is married and has 2 children.  Is retired.  Does   not smoke cigarettes since 1971.  Does not use alcohol.   Review of Systems       The patient complains of dyspnea on exertion.  The patient denies chest pain, syncope, and peripheral edema.    Vital Signs:  Patient profile:   77 year old male Height:      70 inches Weight:      116.50 pounds BMI:     16.78 Pulse rate:    74 / minute Resp:     18 per minute BP sitting:   132 / 68  (left arm) Cuff size:   regular  Vitals Entered By: Celestia Khat, CMA (March 12, 2011 4:14 PM)  Physical Exam  General:  Well developed, well nourished, no acute distress.,VERY THIN Head:  Normocephalic and atraumatic. Eyes:  PERRLA, no icterus. Mouth:  Teeth, gums and palate normal. Oral mucosa normal. Neck:  Supple; no masses or thyromegaly. Chest Wall:  Well healed PPM incision. Lungs:  Clear throughout to auscultation. Heart:  Regular rate and rhythm; no murmurs, rubs,  or bruits. Abdomen:  SOFT,NONTENDER, NO MASS OR HSM,BS+ Msk:  Symmetrical with no gross deformities. Normal posture.,AMBULATEDS WITH A CANE Pulses:  pulses normal in all 4 extremities Extremities:  No clubbing or cyanosis. Neurologic:  Alert and  oriented x4;  grossly normal neurologically.HARD OF HEARING   PPM Specifications Following MD:  Willie Bunting, MD     PPM Vendor:  Medtronic     PPM Model Number:  [id number]     PPM Serial Number:  [id number] PPM DOI:  04/17/2007     PPM Implanting MD:  Willie Bunting, MD  Lead 1    Location: RA     DOI: 04/17/2007     Model #: [id number]     Serial #: [id number]     Status: active Lead 2    Location: RV     DOI: 04/17/2007     Model #: [id number]     Serial #: [id number]     Status: active   Indications:  Tachy-Brady Syndrome;PAF   PPM Follow Up Pacer Dependent:  No      Episodes Coumadin:  No  Parameters Mode:  DDDR     Lower Rate Limit:  60     Upper Rate Limit:  130 Paced AV Delay:  250     Sensed AV Delay:  200 MD Comments:  His PPM is working normally. Will followup in several months.  Impression & Recommendations:  Problem # 1:  CARDIAC PACEMAKER IN SITU (ICD-V45.01) His device is working normally. Will recheck in several months.  Problem # 2:  ATRIAL FIBRILLATION (ICD-427.31) His symptoms are well controlled. He will continue meds as below and followup in several months. His updated medication list for  this problem includes:    Amiodarone Hcl 200 Mg Tabs (Amiodarone hcl) .Marland Kitchen... Take 1/2  tablet by mouth two times a day    Atenolol 50 Mg Tabs (Atenolol) .Marland Kitchen... Take one tablet by mouth daily    Aspirin 81 Mg Tbec (Aspirin) .Marland Kitchen... Take one tablet by mouth daily  Patient Instructions: 1)  Your physician wants you to follow-up in:6 months in the device clinic  You will receive a reminder letter in the mail two  months in advance. If you don't receive a letter, please call our office to schedule the follow-up appointment. 2)  Your physician recommends that you continue on your current medications as directed. Please refer to the Current Medication list given to you today.

## 2011-03-27 ENCOUNTER — Ambulatory Visit: Payer: Self-pay | Admitting: Internal Medicine

## 2011-03-29 ENCOUNTER — Ambulatory Visit: Payer: Self-pay | Admitting: Internal Medicine

## 2011-03-29 LAB — CBC
Hemoglobin: 11.3 g/dL — ABNORMAL LOW (ref 13.0–17.0)
MCHC: 33.7 g/dL (ref 30.0–36.0)
MCV: 104.2 fL — ABNORMAL HIGH (ref 78.0–100.0)
RBC: 3.22 MIL/uL — ABNORMAL LOW (ref 4.22–5.81)
WBC: 16.6 10*3/uL — ABNORMAL HIGH (ref 4.0–10.5)

## 2011-03-29 LAB — COMPREHENSIVE METABOLIC PANEL
ALT: 24 U/L (ref 0–53)
AST: 24 U/L (ref 0–37)
CO2: 25 mEq/L (ref 19–32)
Chloride: 105 mEq/L (ref 96–112)
Creatinine, Ser: 1.42 mg/dL (ref 0.4–1.5)
GFR calc Af Amer: 58 mL/min — ABNORMAL LOW (ref >60)
GFR calc non Af Amer: 48 mL/min — ABNORMAL LOW (ref >60)
Glucose, Bld: 83 mg/dL (ref 70–99)
Sodium: 137 mEq/L (ref 135–145)
Total Bilirubin: 0.4 mg/dL (ref 0.3–1.2)

## 2011-03-29 LAB — URINALYSIS, ROUTINE W REFLEX MICROSCOPIC
Glucose, UA: NEGATIVE mg/dL
Ketones, ur: NEGATIVE mg/dL
pH: 7 (ref 5.0–8.0)

## 2011-03-29 LAB — URINE MICROSCOPIC-ADD ON

## 2011-03-29 LAB — DIFFERENTIAL
Eosinophils Relative: 1 % (ref 0–5)
Lymphocytes Relative: 7 % — ABNORMAL LOW (ref 12–46)
Lymphs Abs: 1.2 10*3/uL (ref 0.7–4.0)

## 2011-04-02 LAB — CSF CELL COUNT WITH DIFFERENTIAL
Eosinophils, CSF: 2 % — ABNORMAL HIGH (ref 0–1)
Monocyte-Macrophage-Spinal Fluid: 22 % (ref 15–45)
RBC Count, CSF: 30250 /mm3 — ABNORMAL HIGH
Tube #: 3
WBC, CSF: 230 /mm3 — ABNORMAL HIGH (ref 0–5)

## 2011-04-02 LAB — BASIC METABOLIC PANEL
BUN: 11 mg/dL (ref 6–23)
BUN: 15 mg/dL (ref 6–23)
BUN: 21 mg/dL (ref 6–23)
Calcium: 9 mg/dL (ref 8.4–10.5)
Calcium: 8.4 mg/dL (ref 8.4–10.5)
Calcium: 9.1 mg/dL (ref 8.4–10.5)
Creatinine, Ser: 1.02 mg/dL (ref 0.4–1.5)
Creatinine, Ser: 1.12 mg/dL (ref 0.4–1.5)
GFR calc Af Amer: >60 mL/min (ref >60)
GFR calc non Af Amer: >60 mL/min (ref >60)
GFR calc non Af Amer: 43 mL/min — ABNORMAL LOW (ref >60)
GFR calc non Af Amer: >60 mL/min (ref >60)
Glucose, Bld: 106 mg/dL — ABNORMAL HIGH (ref 70–99)
Potassium: 4.1 mEq/L (ref 3.5–5.1)

## 2011-04-02 LAB — CBC
HCT: 29.9 % — ABNORMAL LOW (ref 39.0–52.0)
HCT: 28.1 % — ABNORMAL LOW (ref 39.0–52.0)
HCT: 30 % — ABNORMAL LOW (ref 39.0–52.0)
HCT: 32.3 % — ABNORMAL LOW (ref 39.0–52.0)
HCT: 35 % — ABNORMAL LOW (ref 39.0–52.0)
Hemoglobin: 11 g/dL — ABNORMAL LOW (ref 13.0–17.0)
Hemoglobin: 11.4 g/dL — ABNORMAL LOW (ref 13.0–17.0)
MCHC: 34.6 g/dL (ref 30.0–36.0)
MCV: 105 fL — ABNORMAL HIGH (ref 78.0–100.0)
Platelets: 173 10*3/uL (ref 150–400)
Platelets: 175 10*3/uL (ref 150–400)
Platelets: 144 10*3/uL — ABNORMAL LOW (ref 150–400)
Platelets: 156 10*3/uL (ref 150–400)
Platelets: 195 10*3/uL (ref 150–400)
RBC: 3.12 MIL/uL — ABNORMAL LOW (ref 4.22–5.81)
RDW: 12.9 % (ref 11.5–15.5)
RDW: 13.1 % (ref 11.5–15.5)
RDW: 14.1 % (ref 11.5–15.5)
RDW: 14.2 % (ref 11.5–15.5)
WBC: 5 10*3/uL (ref 4.0–10.5)
WBC: 6.5 10*3/uL (ref 4.0–10.5)
WBC: 7.5 10*3/uL (ref 4.0–10.5)
WBC: 7.9 10*3/uL (ref 4.0–10.5)

## 2011-04-02 LAB — VDRL, CSF: VDRL Quant, CSF: NONREACTIVE

## 2011-04-02 LAB — URINE MICROSCOPIC-ADD ON

## 2011-04-02 LAB — DIFFERENTIAL
Basophils Absolute: 0.1 10*3/uL (ref 0.0–0.1)
Basophils Relative: 1 % (ref 0–1)
Lymphocytes Relative: 11 % — ABNORMAL LOW (ref 12–46)
Monocytes Absolute: 1 10*3/uL (ref 0.1–1.0)
Neutro Abs: 5.9 10*3/uL (ref 1.7–7.7)
Neutrophils Relative %: 75 % (ref 43–77)

## 2011-04-02 LAB — ANGIOTENSIN CONVERTING ENZYME, CSF: Angio Convert Enzyme: <3 U/L (ref <16)

## 2011-04-02 LAB — CULTURE, BLOOD (ROUTINE X 2): Culture: NO GROWTH

## 2011-04-02 LAB — URINALYSIS, ROUTINE W REFLEX MICROSCOPIC
Bilirubin Urine: NEGATIVE
Bilirubin Urine: NEGATIVE
Ketones, ur: NEGATIVE mg/dL
Nitrite: NEGATIVE
Nitrite: NEGATIVE
Protein, ur: NEGATIVE mg/dL
Protein, ur: 30 mg/dL — AB
Specific Gravity, Urine: 1.01 (ref 1.005–1.030)
Specific Gravity, Urine: 1.021 (ref 1.005–1.030)
Urobilinogen, UA: 0.2 mg/dL (ref 0.0–1.0)
Urobilinogen, UA: 0.2 mg/dL (ref 0.0–1.0)

## 2011-04-02 LAB — CSF CULTURE W GRAM STAIN: Culture: NO GROWTH

## 2011-04-02 LAB — APTT
aPTT: 34 seconds (ref 24–37)
aPTT: 31 seconds (ref 24–37)

## 2011-04-02 LAB — COMPREHENSIVE METABOLIC PANEL
ALT: 19 U/L (ref 0–53)
Albumin: 4 g/dL (ref 3.5–5.2)
Alkaline Phosphatase: 48 U/L (ref 39–117)
Alkaline Phosphatase: 62 U/L (ref 39–117)
BUN: 20 mg/dL (ref 6–23)
BUN: 30 mg/dL — ABNORMAL HIGH (ref 6–23)
CO2: 22 mEq/L (ref 19–32)
Chloride: 101 mEq/L (ref 96–112)
Chloride: 99 mEq/L (ref 96–112)
Creatinine, Ser: 1.3 mg/dL (ref 0.4–1.5)
GFR calc non Af Amer: 53 mL/min — ABNORMAL LOW (ref >60)
Glucose, Bld: 95 mg/dL (ref 70–99)
Glucose, Bld: 120 mg/dL — ABNORMAL HIGH (ref 70–99)
Potassium: 3.3 mEq/L — ABNORMAL LOW (ref 3.5–5.1)
Potassium: 4.1 mEq/L (ref 3.5–5.1)
Sodium: 130 mEq/L — ABNORMAL LOW (ref 135–145)
Total Bilirubin: 0.6 mg/dL (ref 0.3–1.2)
Total Bilirubin: 0.8 mg/dL (ref 0.3–1.2)

## 2011-04-02 LAB — POCT CARDIAC MARKERS
CKMB, poc: <1 ng/mL — ABNORMAL LOW (ref 1.0–8.0)
Myoglobin, poc: 54.5 ng/mL (ref 12–200)

## 2011-04-02 LAB — PROTIME-INR
INR: 1.1 (ref 0.00–1.49)
INR: 1 (ref 0.00–1.49)
Prothrombin Time: 14.6 seconds (ref 11.6–15.2)
Prothrombin Time: 13.2 seconds (ref 11.6–15.2)

## 2011-04-02 LAB — URINE CULTURE
Colony Count: NO GROWTH
Culture: NO GROWTH

## 2011-04-02 LAB — PROTEIN AND GLUCOSE, CSF: Glucose, CSF: <20 mg/dL — ABNORMAL LOW (ref 43–76)

## 2011-04-02 LAB — SEDIMENTATION RATE
Sed Rate: 3 mm/hr (ref 0–16)
Sed Rate: 5 mm/hr (ref 0–16)

## 2011-04-03 LAB — HEMOGLOBIN AND HEMATOCRIT, BLOOD
HCT: 28.7 % — ABNORMAL LOW (ref 39.0–52.0)
Hemoglobin: 10.1 g/dL — ABNORMAL LOW (ref 13.0–17.0)
Hemoglobin: 8.7 g/dL — ABNORMAL LOW (ref 13.0–17.0)
Hemoglobin: 9.2 g/dL — ABNORMAL LOW (ref 13.0–17.0)
Hemoglobin: 9.4 g/dL — ABNORMAL LOW (ref 13.0–17.0)

## 2011-04-03 LAB — CBC
HCT: 26.5 % — ABNORMAL LOW (ref 39.0–52.0)
Hemoglobin: 9.3 g/dL — ABNORMAL LOW (ref 13.0–17.0)
MCHC: 35 g/dL (ref 30.0–36.0)
MCHC: 35.1 g/dL (ref 30.0–36.0)
MCV: 103.4 fL — ABNORMAL HIGH (ref 78.0–100.0)
Platelets: 140 10*3/uL — ABNORMAL LOW (ref 150–400)
RBC: 2.88 MIL/uL — ABNORMAL LOW (ref 4.22–5.81)
RDW: 13.5 % (ref 11.5–15.5)
WBC: 7 10*3/uL (ref 4.0–10.5)

## 2011-04-03 LAB — COMPREHENSIVE METABOLIC PANEL
Albumin: 3.2 g/dL — ABNORMAL LOW (ref 3.5–5.2)
Alkaline Phosphatase: 39 U/L (ref 39–117)
BUN: 22 mg/dL (ref 6–23)
Calcium: 8.7 mg/dL (ref 8.4–10.5)
Creatinine, Ser: 1.5 mg/dL (ref 0.4–1.5)
Glucose, Bld: 106 mg/dL — ABNORMAL HIGH (ref 70–99)
Total Protein: 5.2 g/dL — ABNORMAL LOW (ref 6.0–8.3)

## 2011-04-03 LAB — CROSSMATCH: Antibody Screen: NEGATIVE

## 2011-04-03 LAB — APTT: aPTT: 31 seconds (ref 24–37)

## 2011-04-03 LAB — PROTIME-INR
INR: 1.1 (ref 0.00–1.49)
Prothrombin Time: 14.5 seconds (ref 11.6–15.2)

## 2011-04-04 LAB — COMPREHENSIVE METABOLIC PANEL
ALT: 20 U/L (ref 0–53)
Alkaline Phosphatase: 56 U/L (ref 39–117)
CO2: 26 mEq/L (ref 19–32)
GFR calc non Af Amer: 51 mL/min — ABNORMAL LOW (ref >60)
Glucose, Bld: 90 mg/dL (ref 70–99)
Potassium: 3.4 mEq/L — ABNORMAL LOW (ref 3.5–5.1)
Sodium: 135 mEq/L (ref 135–145)

## 2011-04-04 LAB — CBC
HCT: 33.8 % — ABNORMAL LOW (ref 39.0–52.0)
Hemoglobin: 11.4 g/dL — ABNORMAL LOW (ref 13.0–17.0)
Hemoglobin: 9.5 g/dL — ABNORMAL LOW (ref 13.0–17.0)
MCHC: 33.6 g/dL (ref 30.0–36.0)
RBC: 3.25 MIL/uL — ABNORMAL LOW (ref 4.22–5.81)
RDW: 13.4 % (ref 11.5–15.5)

## 2011-04-04 LAB — CROSSMATCH
ABO/RH(D): B NEG
Antibody Screen: NEGATIVE

## 2011-04-04 LAB — URINALYSIS, ROUTINE W REFLEX MICROSCOPIC
Bilirubin Urine: NEGATIVE
Glucose, UA: NEGATIVE mg/dL
Ketones, ur: NEGATIVE mg/dL
Protein, ur: 30 mg/dL — AB

## 2011-04-04 LAB — BASIC METABOLIC PANEL
Calcium: 8.6 mg/dL (ref 8.4–10.5)
GFR calc Af Amer: >60 mL/min (ref >60)
GFR calc non Af Amer: >60 mL/min (ref >60)
Glucose, Bld: 99 mg/dL (ref 70–99)
Sodium: 139 mEq/L (ref 135–145)

## 2011-04-04 LAB — PROTIME-INR: Prothrombin Time: 14.2 seconds (ref 11.6–15.2)

## 2011-05-08 NOTE — Discharge Summary (Signed)
NAMEAPOLINAR, BERO                  ACCOUNT NO.:  [id number]   MEDICAL RECORD NO.:  [id number]          PATIENT TYPE:  INP   LOCATION:  1427                         FACILITY:  [facility]   PHYSICIAN:  Barbette Hair. Arlyce Dice, MD,FACGDATE OF BIRTH:  Feb 19, 1924   DATE OF ADMISSION:  04/27/2009  DATE OF DISCHARGE:  04/30/2009                               DISCHARGE SUMMARY   ADMITTING DIAGNOSES:  1. An 77 year old white male with acute gastrointestinal bleed, likely      diverticular.  2. Anemia secondary to #1.  3. Mild hypotension secondary to #1.  4. History of atrial fibrillation.  5. Carotid artery stenosis, status post carotid endarterectomy.  6. Status post pacemaker.   DISCHARGE DIAGNOSES:  1. Resolved, self-limited, lower gastrointestinal bleed, high      likelihood of a diverticular etiology with previous history of      diverticular bleeding.  2. Anemia, stable.  No transfusion this admission.  3. Mild hypotension secondary to #1.  4. History of atrial fibrillation.  5. Carotid artery stenosis, status post carotid endarterectomy.  6. Status post pacemaker.   CONSULTATIONS:  None.   PROCEDURES:  None.   BRIEF HISTORY:  Mr. Claytor is a pleasant 77 year old white male known to  Dr. Christella Hartigan who was seen as an urgent add on per Dr. Arlyce Dice on the day of  admission when he presented with acute rectal bleeding.  He was found to  be mildly hypotensive in the office with a blood pressure of 94/46,  hemoglobin was 10 with a baseline of approximately 12.  He had onset on  the morning of admission with 2 painless, frankly bloody bowel  movements.  Of note, he had also been taking Advil for arthritic pain.  He had not noted any melena but rather just obviously grossly bloody  stools.  He had some associated weakness but no lightheadedness,  dizziness, syncope, or presyncope.  He was admitted to the hospital from  the office with a probable acute diverticular bleed with prior history  of  diverticular bleeding.  His last colonoscopy was done in 2006 per Dr.  Bartolo Darter and at that time had had scattered diverticulosis throughout the  colon.   LABORATORY STUDIES ON ADMISSION:  Apr 27, 2009, hemoglobin 9.3,  hematocrit of 26.5, WBC of 4.5, MCV of 103, platelets 140.  On Apr 28, 2009, hemoglobin 10.4, hematocrit of 29.7.  On Apr 29, 2009, hemoglobin  10.4 and hematocrit of 30.4 and then on Apr 30, 2009, hemoglobin 8.7,  hematocrit of 25.0.  Pro time 14.5, INR 1.1, PTT of 31.  Electrolytes  within normal limits.  BUN 22, creatinine 1.5 on admission.  Albumin  3.2.  Liver function studies normal.   HOSPITAL COURSE:  Patient was admitted to the service of Dr. Melvia Heaps.  He was cared for by Dr. Russella Dar and Dr. Arlyce Dice during his stay.  He had a benign hospital course, was observed on a clear liquid diet,  had very little evidence of any ongoing active bleeding after admission  fortunately.  His diet was advanced to full  liquids on Apr 28, 2009.  His  hemoglobin stabilized in the 9 range.  He did not require transfusions  during this admission though this was contemplated.  He seemed to be  tolerating his anemia well.  By Apr 30, 2009, he had had no further  evidence of any active bleeding, was feeling well.  Vital signs were  stable.  Hemoglobin was 8.7 though again no evidence of any active  bleeding for approximately 36 hours and he was allowed discharge to home  with instructions to follow up in the office within 1 week and to have  followup hemoglobin done prior to that visit.  He was to resume all of  his usual medications and to call for any problems with any active re-  bleeding.  He was to avoid antiinflammatories for the time being and  hold his aspirin for 1 week and then resume at 81 mg daily.      Amy Esterwood, PA-C      Robert D. Arlyce Dice, MD,FACG  Electronically Signed    AE/MEDQ  D:  06/14/2009  T:  06/14/2009  Job:  [id number]

## 2011-05-08 NOTE — Procedures (Signed)
DUPLEX ULTRASOUND OF ABDOMINAL AORTA   INDICATION:  Follow up AAA.   HISTORY:  Diabetes:  No.  Cardiac:  Pacemaker.  Hypertension:  No.  Smoking:  Previous.  Connective Tissue Disorder:  Family History:  No.  Previous Surgery:  No.   DUPLEX EXAM:         AP (cm)                   TRANSVERSE (cm)  Proximal             1.73 cm                   1.89 cm  Mid                  1.76 cm                   1.71 cm  Distal               3.12 cm                   3.23 cm  Right Iliac          2.79 cm                   2.58 cm  Left Iliac           1.30 cm                   1.26 cm   PREVIOUS:  Date: 03/08/2009  AP:  2.95  TRANSVERSE:  3.19   IMPRESSION:  1. Abdominal aortic aneurysm noted with largest measurement of 3.12 cm      X 3.23 cm.  2. Right iliac aneurysm measuring 2.79 X 2.58 cm.   ___________________________________________  Quita Skye. Hart Rochester, M.D.   EM/MEDQ  D:  10/16/2010  T:  10/16/2010  Job:  [id number]

## 2011-05-08 NOTE — Procedures (Signed)
CAROTID DUPLEX EXAM   INDICATION:  Followup carotid artery disease.   HISTORY:  Diabetes:  No.  Cardiac:  Pacemaker.  Hypertension:  No.  Smoking:  Previous.  Previous Surgery:  Left CEA 03/30/2009.  CV History:  Currently asymptomatic.  Amaurosis Fugax Yes No, Paresthesias Yes No, Hemiparesis Yes No                                       RIGHT             LEFT  Brachial systolic pressure:         138               140  Brachial Doppler waveforms:         Within normal limits                Within normal limits  Vertebral direction of flow:        Antegrade         Antegrade  DUPLEX VELOCITIES (cm/sec)  CCA peak systolic                   61                55  ECA peak systolic                   44                40  ICA peak systolic                   74                80  ICA end diastolic                   25                29  PLAQUE MORPHOLOGY:                  Heterogeneous     Intimal wall  thickening  PLAQUE AMOUNT:                      Moderate          Mild  PLAQUE LOCATION:                    ICA, CCA          CCA, ICA   IMPRESSION:  1. Right internal carotid artery velocities suggest a 1%-39% stenosis.  2. Left internal carotid artery shows no evidence of restenosis,      status post carotid endarterectomy.  3. Mild intimal wall thickening noted in the left common carotid      artery and internal carotid artery.   ___________________________________________  Quita Skye Hart Rochester, M.D.   EM/MEDQ  D:  10/16/2010  T:  10/16/2010  Job:  [id number]

## 2011-05-08 NOTE — Assessment & Plan Note (Signed)
[facility]                            CARDIOLOGY OFFICE NOTE   MACY, LINGENFELTER                           MRN:          [id number]  DATE:08/15/2007                            DOB:          03/30/1924    Mr. Feuerborn is seen today in followup.  Unfortunately, the patient has  had somewhat fragmented care.   He was last seen by Dr. Graciela Husbands on Apr 29, 2007.  He is probably most  closely followed by Dr. Ladona Ridgel.  I had seen the patient in May.  He also  has multiple physicians in [place].  He sees a cardiologist at [facility] named Maisie Fus.  He also sees a Physiological scientist in  [facility] named Dr. Lawernce Ion.   Alven has a house in [place] and one here in town that his son tends to  use.   Alif's problems have been multiple.  He has had paroxysmal atrial  fibrillation with sick sinus syndrome, status post pacemaker therapy.  When he last saw Dr. Graciela Husbands, he was detected to have increasing episodes  of PAF, which were documented by mode switching on his pacemaker.  Dr.  Graciela Husbands gave him alternatives, including Inderal, nadolol, and atenolol.  It appears that he has been taking atenolol 50 mg a day.  He feels his  palpitations are better.  He really has not had any further spells.  As  an anti-arrhythmic, he continues on amiodarone 200 a day, and he is on  chronic warfarin.   The patient has been having some substernal chest pressure.  It does not  sound anginal.  He has no documented coronary artery disease.  He says  he has had a stress test in [place] about a year ago.  His pain is  really nonexertional.  He walks a mile to a mile and a half a day and  can do it without chest pain.  It is occasionally sharp, lasting  minutes.  It can radiate to the left arm and has not been increasing in  frequency.  He does not take a nitroglycerin for it.   He tells me his stress tests in the past have been fine.   The patient also has a history of 60% carotid  stenosis.  He said he just  had a follow-up duplex with Dr. Lawernce Ion in [place].  I do not know the  results, but obviously he did not have an endarterectomy.  He has not  had any TIAs or CVAs.   He has been compliant with his medications and has a history of high  blood pressure, which the atenolol has helped as well.   REVIEW OF SYSTEMS:  Otherwise negative.   MEDICATIONS:  1. Omeprazole 20 a day.  2. Gemfibrozil 600 b.i.d.  3. Amiodarone 200 a day.  4. Warfarin as directed.  5. Simvastatin 20 a day.  6. Aspirin daily.  7. Ranitidine.  8. Atenolol 50 a day.   PHYSICAL EXAMINATION:  His exam is remarkable for an elderly white male  in no distress.  Affect is appropriate.  Weight is 131, which is stable.  Blood pressure is 128/66.  Pulse is 85  and regular.  He is afebrile.  Respiratory rate is 14.  NECK:  Supple.  There is no JVP elevation.  No lymphadenopathy.  No  thyromegaly.  He has a left carotid bruit.  LUNGS:  Clear with good diaphragmatic motion.  No wheezing.  CARDIAC:  There is an S1 and S2 with normal heart sounds.  Pacemaker is  in good position under the clavicle.  PMI is normal.  ABDOMEN:  Benign.  Bowel sounds are positive.  No hepatosplenomegaly.  No hepatojugular reflux.  No tenderness.  No AAA.  No bruits.  EXTREMITIES:  Distal pulses are intact with no edema.  NEURO:  Nonfocal.  There is no lymphadenopathy.  Muscular exam shows no  weakness.  There are no focal neurological deficits.   IMPRESSION:  1. Paroxysmal atrial fibrillation, currently stabilized on Coumadin      and amiodarone.  He will follow up with Dr. Ladona Ridgel in six months to      re-interrogate his pacemaker to see if there has been mode      switching.  He seems to be doing better on atenolol, and I would      agree that he needs to be on some atrioventricular nodal blocking      drug.  2. Chest pressure:  Doubt that it is coronary disease.  He has no      documented history.  I explained to  him that he should probably      continue to follow up with Dr. Laneta Simmers, who has been doing his      stress tests over the years.  I spoke with Khori, and it is not clear      to me in the long run where his cardiology care would be.  I think      he decided to have his pacemaker done here but would like to in      general get back to being a cardiology patient in [place].  I      explained to him to talk to Dr. Ladona Ridgel about this in six months and      once his rhythm is stabilized, I suspect he will be seen up in      [place] mostly, as he spends most of his time there.  3. Left carotid bruit.  No transient ischemic attacks.  Again, this is      being followed by Dr. Lawernce Ion, a vascular surgeon in [place].      Patient understands that he needs a yearly duplex.  4. High blood pressure, currently stable.  Will continue atenolol 50      mg a day.  5. Reflux:  Continue omeprazole 20 a day.  6. Hypercholesterolemia:  Not in the setting of coronary disease.  He      is due to have a physical next week with his primary care MD.  I      have told him to make sure he has a lipid and liver profile.   I suspect I may not see Mr. Szalkowski again.  I think it is more important  to resolve the issues in regards to his atrial fibrillation and set up  pacemaker therapy checks.  He has a heart doctor and a vascular surgeon  following his general cardiology care in [place].     Noralyn Pick. Eden Emms, MD, [facility]  Electronically Signed  PCN/MedQ  DD: 08/15/2007  DT: 08/17/2007  Job #: [id number]

## 2011-05-08 NOTE — Assessment & Plan Note (Signed)
[facility]                         ELECTROPHYSIOLOGY OFFICE NOTE   BRITT, PETRONI                           MRN:          [id number]  DATE:06/19/2007                            DOB:          Sep 18, 1924    Mr. Mihalik returns today for followup.  He is a very pleasant elderly  male with a history of symptomatic  bradycardia and paroxysmal atrial  fibrillation, who is status post initiation of amiodarone therapy (low  dose) and status post dual-chamber pacemaker insertion.  He returns  today for followup.  He had no specific complaints today.  The patient  denies chest pain.  He was seen in our office back in May and at that  time he was placed on Atenolol 50 mg a day in conjunction with his  amiodarone.  At that time he had had some breakthrough episodes of  atrial fibrillation.  He had no specific complaints today and he has  gone back to weeding and cutting the grass with his push lawnmower.   On exam, he is a pleasant, elderly appearing man in no acute distress.  Blood pressure was 118/78, the pulse 68  and regular.  The respirations  were 18, the weight was 129 pounds.  NECK:  No jugular venous distention.  LUNGS:  Clear bilaterally to auscultation.  No wheezes, rales or rhonchi  present.  CARDIOVASCULAR:  A regular rate and rhythm with normal S1 and S2.  EXTREMITIES:  Demonstrated no edema.   Interrogation of his pacemaker demonstrates a Medtronic Sensa with P and  R waves of 2 and 15 respectively.  The impedence is 536 in the atrium,  657 in the ventricle.  The threshold is 0.75 at 0.4 in the atrium, 1 at  0.4 in the ventricle.  The battery voltage was 2.78 volts.  Underlying  rhythm was sinus bradycardia.  He had 33 mode switching episodes  encompassing 7% of the duration of his pacing histograms.  He was V-  paced 2% of the time.  Today we turned his output down to 2 at 0.4 in  the atrium, 2.5 at 0.4 in the right ventricle.    IMPRESSION:  1. Symptomatic  bradycardia.  2. Paroxysmal atrial fibrillation.  3. Status post pacemaker insertion.   DISCUSSION:  Overall, Mr. Archuleta is stable and his pacemaker is working  normally.  We will see him back in April of 2009, sooner if he has  additional problems.     Doylene Canning. Ladona Ridgel, MD  Electronically Signed    GWT/MedQ  DD: 06/19/2007  DT: 06/19/2007  Job #: [id number]   cc:   [place] Dr. Philis Fendt in [place],

## 2011-05-08 NOTE — Consult Note (Signed)
[name], BELLUCCI                  ACCOUNT NO.:  [id number]   MEDICAL RECORD NO.:  [id number]          PATIENT TYPE:  INP   LOCATION:  1830                         FACILITY:  [facility]   PHYSICIAN:  Mobolaji B. Bakare, M.D.DATE OF BIRTH:  Jul 19, 1924   DATE OF CONSULTATION:  DATE OF DISCHARGE:                                 CONSULTATION   PRIMARY CARE PHYSICIAN:  Unassigned.   REFERRING PHYSICIAN:  Donalee Citrin, M.D.   REASON FOR CONSULTATION:  Evaluation for confusion and manage other  chronic medical problems.   HISTORY OF PRESENT ILLNESS:  [name] is a 77 year old Caucasian male  who has chronic back pain.  He has a new T9/L4 compression fracture.  He  was seen as an outpatient by Dr. Wynetta Emery.  Pain has increased in intensity  in the last 3 days.  The patient has increased Dilaudid from 1 to 1-1/2  tablets every 5 to 6 hours.  Wife subsequently noted that he has become  increasingly confused and gets easily disoriented.  There has not been  any fever, chills, dysuria or foul-smelling urine.  The patient has had  some cough intermittently productive of sputum but no pleuritic chest  pain.  Last night, he got more confused and pain was not getting any  better, and he came to the emergency room today.  He has been evaluated  by Dr. Wynetta Emery, and is being planned for possible vertebroplasty.  We have  been asked to evaluate confusion and also manage on medical problems,  which are listed below.   REVIEW OF SYSTEMS:  He has had some nausea but no vomiting or diarrhea.  He has got a history of constipation.  The patient has some cough, which  is intermittently productive of sputum.   PAST MEDICAL HISTORY:  Includes:  1. Chronic atrial fibrillation.  2. Tachy-brady syndrome status post pacemaker insertion.  3. History of GI bleed.  4. Chronic anticoagulation with Coumadin.  5. History of carotid artery stenosis (right).   PAST SURGICAL HISTORY:  Herniorrhaphy, appendectomy, cataract  surgery.   CURRENT MEDICATIONS:  Include:  1. Amiodarone 200 mg 1/2 tablet b.i.d.  2. Omeprazole 20 mg daily.  3. Ditropan 5 b.i.d.  4. Tylenol p.r.n.  5. Lortab 1-1/2 tablets q.5-6 Ch. p.r.n.  6. Iron supplement.  7. Aspirin 81 mg daily.  8. Multivitamin one daily.  9. Fish oil.  10.Folic acid.  11.Potassium supplement 7 mEq daily.  12.Guaifenesin 400 mg daily.  13.Zocor 40 mg daily.  14.Gabapentin 100 mg daily.  15.Ranitidine 150 mg once tablet daily.  16.Atenolol 50 mg 1 tablet daily.  17.Coumadin 2 mg daily.  18.Calcium 600 mg, 1 tablet daily.  19.Glucosamine and chondroitin, one tablet two times a day.   ALLERGIES:  INCLUDE:  1. PENICILLIN.  2. STREPTOMYCIN.   FAMILY HISTORY:  Noncontributory.   SOCIAL HISTORY:  The patient lives at home with his wife.  He is a  retired Merchandiser, retail.  He currently does not smoke cigarettes, but is an  ex-smoker.  He does not drink alcohol.   PHYSICAL EXAMINATION:  VITALS:  Temperature 98.8, blood pressure 147/75,  pulse 67, respiratory 16, oxygen sats 96%.  GENERAL:  On examination, the patient is awake.  He is oriented to place  and person.  He is not agitated, not in respiratory distress.  HEENT:  Normocephalic, atraumatic.  Pupils equal, round and reactive to light.  No carotid bruit.  No elevated JVD.  Mucous membranes dry.  No oral  thrush, although the tongue is coated.  LUNGS:  Clinically clear to auscultation.  Diminished air entry, lung  bases.  CVS:  S1, S3, irregular.  ABDOMEN:  Nondistended, soft, nontender, bowel sounds present.  No  palpable organomegaly.  EXTREMITIES:  No pedal edema or calf tenderness.  There is no distal  cyanosis.  Dorsalis pedis pulses 1 to 2+ bilaterally.  CNS:  No focal neurological deficit.   INITIAL LABORATORY DATA:  Sodium 133, potassium 3.9, chloride 102, CO2  24, BUN 15, creatinine 1.17, calcium 8.7, PTT 61, PTT 46.8, INR 4.8,  white cells 4.4, hemoglobin 13.3, hematocrit 38.3, platelets  273.  Normal differential.  MCV 101.6.   ASSESSMENT AND PLAN:  1. [name] is an 77 year old Caucasian male with T9/L4 compression      fracture and acute on-chronic pain.  He is currently having      confusion, which may be related to increased pain medication.  Will      need to exclude infection.  Will obtain a chest x-ray and send      urine for UA and microscopy.  I agree with using Vicodin 1-2      tablets q.6 h. p.r.n. and morphine 1-2 mg IV q.2 h. p.r.n. for      severe pain.  Will continue hydration with IV fluids.  2. Coagulopathy, holding Coumadin for procedure.  The patient has no active bleeding at this time.  If procedure is  planned soon, may require FFP.  Will check PT/INR again in the morning.  Thank you for the consult.  Will follow.     Mobolaji B. Corky Downs, M.D.  Electronically Signed    MBB/MEDQ  D:  04/12/2008  T:  04/12/2008  Job:  [id number]   cc:   Donalee Citrin, M.D.

## 2011-05-08 NOTE — Assessment & Plan Note (Signed)
[facility]                         ELECTROPHYSIOLOGY OFFICE NOTE   MUTASIM, TUCKEY                         MRN:          [id number]  DATE:09/03/2008                            DOB:          30-Oct-1924    Mr.  Iodice returns today for followup.  He is a very pleasant elderly  male with symptomatic tachy-brady syndrome who underwent permanent  pacemaker insertion back in April.  He returns today for followup.  The  patient's main problem today is that of chronic back pain associated  with numbness in his legs.  With regard to his atrial arrhythmias, he  was placed on amiodarone after his pacemaker implantation and has done  very nicely as far as with regard to maintenance of sinus rhythm.  He  has had no AFib in the last several months.  The patient denies chest  pain.  He denies shortness of breath.  His only complaints really are  lower back pain, some numbness, and tingling in his legs bilaterally.   MEDICINES:  1. Gabapentin 300 t.i.d.  2. Probiotic.  3. Omeprazole 20 twice a day.  4. Amiodarone 200 a day.  5. Simvastatin 20 mg daily.  6. Atenolol 50 mg daily.   PHYSICAL EXAMINATION:  GENERAL:  He is a pleasant elderly man, in no  distress.  VITAL SIGNS:  Blood pressure today was 124/66, the pulse was 61 and  regular, respirations were 18, and the weight was 121 pounds.  NECK:  No jugular venous distention.  LUNGS:  Clear bilaterally to auscultation.  No wheezes, rales, or  rhonchi are present.  CARDIAC:  Regular rate and rhythm.  Normal S1 and S2.  EXTREMITIES:  Demonstrated no edema.   Interrogation of his pacemaker demonstrates Medtronic Floyd, the P-  waves were 2, the R-waves 16, the impedance was 473 in the A and 592 in  the RV, the threshold 0.5, 0.4 in the atrium and 1.4 in the RV.  The  battery voltage was 2.78 volts.  His underlying rhythm was sinus brady  at 39 beats per minute.  The patient was 99% A paced and 5% V paced.   He  had gone into AFib less than 2% of the time since we last checked him a  year ago.   IMPRESSION:  1. Symptomatic bradycardia.  2. Status post pacemaker insertion.  3. Paroxysmal atrial fibrillation, nicely controlled with amiodarone      therapy.   DISCUSSION:  Overall, Mr. Rzepka is stable and his pacemaker is working  normally.  We will plan to see the patient back for pacemaker followup  in 1 year.  With regard to the patient's back problems, I have  encouraged him to proceed with any back therapies that might be able to  help  him.  Specifically, he would certainly be a low risk for any  cardiovascular complications from spinal surgery or the back surgeries.     Doylene Canning. Ladona Ridgel, MD  Electronically Signed    GWT/MedQ  DD: 09/03/2008  DT: 09/04/2008  Job #: [id number]   cc:   Jillyn Hidden  Wynetta Emery, M.D.

## 2011-05-08 NOTE — Assessment & Plan Note (Signed)
[facility]                            CARDIOLOGY OFFICE NOTE   [name]                           MRN:          [id number]  DATE:05/05/2007                            DOB:          1924/06/25    [name] is seen today in followup.  I have seen him for PAF and sick  sinus syndrome.  The patient initially was seen at [facility].  He had PAF for the last 8 years or so.  The patient was not  recommended a pacemaker.  We evaluated him and felt he needed a  pacemaker.  He was also started on Coumadin.  His pacemaker was  implanted by Dr. Ladona Ridgel, and this was uncomplicated.  Date of implant was April 17, 2007.  He had a followup interrogation on  May 05, 2007.  At the time his average heart rate was in the 60-70  range.  He had mode switches less than 0.1% of the time.  His pacemaker  is functioning well without diaphragmatic stimulation.   REVIEW OF SYSTEMS:  Remarkable for significant weakness and headache.  His weight has stabilized.  He previously had about a 10 pound weight  loss.  He continues to seem somewhat depressed.  He was concerned about  his amiodarone dose.  His headache was generalized.  There was no  localizing sign.  No radiation down the neck.  He has not had blurred  vision.  He has not had any lower extremity weakness.  His review of  systems is otherwise negative.   MEDICATIONS:  1. Oxybutynin 5 mg b.i.d.  2. Potassium 10 a day.  3. Omeprazole 20 b.i.d.  4. Gemfibrozil 60 b.i.d.  5. Amiodarone 200 a day.  6. Warfarin as directed.  7. Simvastatin 20 a day.  8. An aspirin a day.  9. Gabapentin.  10.Multivitamin.  11.Glucosamine.  12.Fish oil.  13.Atenolol 50 a day.   PHYSICAL EXAMINATION:  GENERAL:  Remarkable for an elderly, thin,  chronically ill-appearing male.  Mood is depressed.  VITAL SIGNS:  Weight is stable at 129, blood pressure is 131/77 he is  not postural, pulse is 65 with probable pacing.   He is afebrile.  Respiratory rate is 12.  HEENT:  Normal.  Carotids are normal without bruit.  There is no JVP  elevation.  There is no thyromegaly.  LUNGS:  Clear.  CHEST:  His pacemaker looks well under the left clavicle.  HEART:  There is an S1 S2 with normal heart sounds.  PMI is normal.  Bowel sounds are positive.  There is no AAA.  No hepatosplenomegaly.  No  hepatojugular reflux.  No renal bruits.  EXTREMITIES:  Distal pulses are intact with no edema.  NEUROLOGIC:  Nonfocal.  There is no muscular weakness.   His EKG is pending.   IMPRESSION:  1. Headache in a patient on Coumadin.  I think that he should have a      followup head CT.  The patient does not have any focal signs and      his  INR has been therapeutic, not super therapeutic.  2. In regards to his sick sinus syndrome and paroxysmal atrial      fibrillation, the pacemaker seems to be doing its job.  He has had      very little mode switching and we will continue his low dose      amiodarone at 200 mg a day.  His average heart rate has been 60-80      and his current dose of atenolol seems fine.  3. In regards to his general malaise and fatigue.  I told him to      follow up with his general medical doctor.  He has normal a left      ventricular function by echocardiogram and I do not think that he      should be having any problems in regards to fatigue from his heart.      From [facility], he does have a history of a 60% left      proximal internal carotid artery stenosis.  This will need to be      followed up with a duplex exam in 6 months.   The patient will follow up with the pacemaker nurses in 6 months.  I  will see him back in about 3 months as long as his head CT is negative.     Willie Pick. Eden Emms, MD, [facility]     PCN/MedQ  DD: 05/05/2007  DT: 05/05/2007  Job #: [id number]

## 2011-05-08 NOTE — H&P (Signed)
HISTORY AND PHYSICAL EXAMINATION   March 08, 2009   Re:  [name], Willie R                  DOB:  05/27/1924   CHIEF COMPLAINT:  Severe left inguinal carotid stenosis - asymptomatic.   HISTORY OF PRESENT ILLNESS:  This is an 77 year old male patient who had  been followed by Dr. Lawernce Ion in [facility] for a small abdominal aortic  aneurysm and asymptomatic carotid stenosis, which has slowly progressed  over the last 3 to 4 years.  He continues to deny any symptoms of  amaurosis fugax, diplopia, blurred vision, syncope, hemiparesis,  aphagia, et Karie Soda.  Recent carotid duplex exam in our office reveals  progression of disease to approximately 80-85% in severity.  He is now  scheduled for a left carotid endarterectomy of this asymptomatic lesion.   PAST MEDICAL HISTORY:  1. Atrial fibrillation.  2. Lumbar spine problems.  3. Negative for diabetes, hypertension, coronary artery disease, COPD,      or hyperlipidemia.   PREVIOUS SURGERIES:  1. Insertion pacemaker.  2. Shoulder surgery.  3. Bilateral cataract surgery.  4. Hernia repair.   FAMILY HISTORY:  Positive for diabetes in his mother, coronary artery  disease in his sister, negative for stroke.   SOCIAL HISTORY:  He is married and has 2 children.  Is retired.  Does  not smoke cigarettes since 1971.  Does not use alcohol.   REVIEW OF SYSTEMS:  Negative for anorexia, weight loss, chest pain,  dyspnea on exertion, PND, orthopnea.  Does have atrial fibrillation by  history.  Has been on anticoagulants in the past, but this was  discontinued because of GI bleed 2 years ago.  He has reflux  esophagitis, diarrhea, urinary frequency, and burning in the lower  extremities with both arthritis and joint pain.   ALLERGIES:  Penicillin and streptomycin.   MEDICATIONS:  1. Amiodarone 200 mg 1 in the morning 1/2 in the evening.  2. Oxybutynin 5 mg 1 in the morning 1 in the evening.  3. Atenolol 50 mg 1 in the evening.  4.  Gabapentin 300 mg 3 times daily.  5. Simvastatin 90 mg 1/2 tablet in the evening.  6. Miacalcin 4 mL 1 spray 1 side daily.  7. Probiotic 6 mg 1 tablet daily.  8. Ibuprofen 200 mg 3 tablets 3 times a day.  9. Omeprazole 20 mg 1 tab twice daily.  10.Aspirin 81 mg daily.  11.Vitamin D 400 mg daily.  12.Calcium 600 mg daily.  13.Guaifenesin 400 mg 1 in the morning 1 in the evening.  14.Fish oil 1200 mg 1 capsule daily.   PHYSICAL EXAM:  Blood pressure 139/84, heart rate is 84, respirations  14.  Generally, he is an elderly male who is in no apparent distress.  Alert and oriented x3.  Neck is supple.  3+ carotid pulses palpable.  There are soft bruits bilaterally.  Neurologic exam is normal.  No  palpable adenopathy in the neck.  Chest is clear to auscultation.  Cardiovascular exam reveals a regular rhythm, no murmurs.  Abdomen is  soft and nontender with a small pulsatile mass.  This approximates 3 to  3.5 cm.  He has 3+ femoral and popliteal pulses bilaterally with well-  perfused lower extremities.   IMPRESSION:  1. Severe left internal carotid stenosis - asymptomatic.  2. History of atrial fibrillation, currently in sinus rhythm with      pacemaker.   PLAN:  To  admit the patient on March 25 for an elective left carotid  endarterectomy.  Risks and benefits have been thoroughly discussed with  the patient and his wife.  The patient will also consult Dr. Ladona Ridgel  preoperatively for cardiac clearance.  I have offered the patient the  option of returning to [facility] for Dr. Lawernce Ion to perform this surgery,  but the patient's son lives close by and would like to have this  performed in [place].   Quita Skye Hart Rochester, M.D.  Electronically Signed   JDL/MEDQ  D:  03/08/2009  T:  03/09/2009  Job:  [id number]   cc:   Doylene Canning. Ladona Ridgel, MD  Hal T. Stoneking, M.D.  Dr. Illene Labrador. Callis

## 2011-05-08 NOTE — Assessment & Plan Note (Signed)
[facility]                         ELECTROPHYSIOLOGY OFFICE NOTE   DEANTRE, BOURDON                         MRN:          [id number]  DATE:05/10/2008                            DOB:          Nov 16, 1924    Mr. Koble returns today for a follow-up.  He is a very pleasant 77-year-  old man who has a history of bradycardia and is status post pacemaker  insertion.  He has a history of very remote atrial fibrillation and sick  sinus syndrome.  He has a history of hypertension.   The patient returns today for follow-up, and overall he has been stable.  He has had no falls.  He denies any history of GI bleeding.  He has had  no difficulties per se with his pacemaker and continues on Coumadin.  He  has a history of headaches in the past, but none particularly here  lately.   MEDICATIONS:  1. Potassium.  2. Omeprazole 20 mg twice daily.  3. Gemfebril 600 twice daily.  4. Amiodarone 200 mg a day.  5. Warfarin as directed.  6. Simvastatin 20 mg daily.  7. Aspirin 81 mg a day.  8. Gabapentin 100 mg two times a day.  9. Zantac 150 mg every other day.  10.Fish oil.  11.Atenolol 50 mg a day.   PHYSICAL EXAMINATION:  He is a pleasant elderly-appearing man in no  acute distress.  Blood pressure 153/84, pulse 84 and regular,  respirations 18.  Weight 130 pounds.  NECK:  Revealed no jugular distention.  There was no thyromegaly.  LUNGS:  Clear bilaterally to auscultation.  No wheezes, rales or rhonchi  are present.  CARDIOVASCULAR EXAM:  Regular rate and rhythm.  Normal S1 and S2; no  murmurs, rubs or gallops that I could appreciate today.  His pacemaker  site was healed nicely.  ABDOMINAL:  Scaphoid, soft, nontender, nondistended.  There was no  organomegaly.  EXTREMITIES:  Demonstrated no peripheral edema.   EKG:  Demonstrates sinus rhythm.  He was atrial paced.   IMPRESSION:  1. Symptomatic bradycardia.  2. Paroxysmal atrial fibrillation;  maintaining sinus rhythm almost      exclusively on low-dose amiodarone.  3. Dyslipidemia, on statin therapy.   DISCUSSION:  Overall Mr. Beichner is stable.  We spent a considerable  amount of time today with his son talking about the pros and cons of  continuing his Coumadin therapy.  He clearly has atrial fibrillation,  which has been documented on his pacemaker.  For this reason, I have  recommend that we continue his Coumadin.  There is a concern about liver  functions in the setting of amiodarone and statins, and I have recommend  that we check for liver function tests and a CPK.  We will see the  patient back in several months.     Doylene Canning. Ladona Ridgel, MD  Electronically Signed    GWT/MedQ  DD: 05/10/2008  DT: 05/10/2008  Job #: [id number]

## 2011-05-08 NOTE — Consult Note (Signed)
NAMEJABREE, [name]                  ACCOUNT NO.:  [id number]   MEDICAL RECORD NO.:  [id number]          PATIENT TYPE:  OUT   LOCATION:  XRAY                         FACILITY:  [facility]   PHYSICIAN:  Sanjeev K. Deveshwar, M.D.DATE OF BIRTH:  1924/12/08   DATE OF CONSULTATION:  05/04/2008  DATE OF DISCHARGE:                                 CONSULTATION   CHIEF COMPLAINT:  Status post vertebroplasty L4, kyphoplasty at T9,  performed April 20, 2008.   HISTORY OF PRESENT ILLNESS:  This is a pleasant, hard-of-hearing 77-year-  old male referred to Dr. Corliss Skains through the courtesy of Dr. Donalee Citrin  after the patient was admitted to [facility] recently with  confusion and severe back pain.  The patient had a CT scan which  revealed compression fractures at T9 and L4.  Dr. Corliss Skains was asked to  see the patient, and on April 20, 2008 the patient underwent a  vertebroplasty at the L4 level as well as a kyphoplasty at the T9 level.  He returns today, accompanied by his wife, to be seen in followup.   PAST MEDICAL HISTORY:  1. The patient has a history of chronic atrial fibrillation.  He had      been on Coumadin therapy.  This was stopped during his recent      hospitalization for the kyphoplasty procedures.  2. The patient and his wife tell me that he was instructed not to      resume his Coumadin at the time of discharge from the hospital.  He      is due to see his cardiologist Dr. Lewayne Bunting next week for      further instructions regarding Coumadin therapy.  3. The patient does have a history of tachy-brady syndrome.  He is      status post permanent pacemaker insertion.  4. He has a history of GI bleeding.  5. He has a history of a right carotid artery stenosis.  6. At the time of the patient's most recent admission, his INR was      4.8.  7. The patient apparently has a normal ejection fraction.  8. He also has a history of gastroesophageal reflux disease.  9.  Hypertension.  10.Dyslipidemia.  11.Anemia.  12.Colon polyps.   PAST SURGICAL HISTORY:  1. Colon surgery.  2. Renal calculi surgery.  3. Hernia repair.  4. A remote appendectomy.  5. Cataract surgery.   MEDICATIONS DURING HIS RECENT ADMISSION:  1. Amiodarone.  2. Omeprazole.  3. Ditropan.  4. Tylenol p.r.n.  5. Lortab p.r.n.  6. An iron supplement.  7. Aspirin 81 mg.  8. Multivitamin 1 daily.  9. Fish oil.  10.Folic acid.  11.Potassium supplement.  12.Guaifenesin.  13.Zocor.  14.Neurontin.  15.Ranitidine.  16.Atenolol.  17.Calcium.  18.Glucosamine and chondroitin.  19.As noted, he had been on Coumadin, although this was stopped.   ALLERGIES:  1. PENICILLIN.  2. STREPTOMYCIN.  3. ERYTHROMYCIN.   SOCIAL HISTORY:  The patient is married.  He and his wife live in  [place], although they have a son here  in [place], and they spend a  great deal of time in [place].  He is retired from a [employer].  He  does not use alcohol.  He quit smoking in 1972.   FAMILY HISTORY:  His mother died at age 16 from congestive heart  failure.  His father died in his 41s from coronary artery disease.   IMPRESSION AND PLAN:  As noted, the patient returns today to be seen in  followup after undergoing a kyphoplasty or vertebroplasty.  The patient  is a very poor historian.  Much of the history is obtained from the  patient's wife.  Apparently, the patient had significant improvement in  his pain after the procedure.  The wife describes this pain is being a  10 on a 1-10 scale prior to the intervention.  He was also taking  narcotic medications, including Lortab.  The patient's pain improved  significantly after the procedure.  He now takes only Tylenol 2 tablets  3 times per day, although he does continue to have some significant  discomfort.  The patient feels that his pain got better for awhile, then  got worse, and then got better again, but as noted, he is somewhat of a  poor  historian.  The patient's wife feels that he is significantly  improved.   The patient was having physical therapy at home.  He was ambulating with  a walker.  Apparently, the physical therapist just released the patient  to ambulate independently without his walker.   The patient reports his appetite has been fair.  He has been sleeping  for the most part okay, except he has peripheral neuropathy which tends  to keep him awake at night on occasion.   Dr. Corliss Skains reviewed the results of the vertebroplasty and kyphoplasty  with the patient and his wife.  He showed them the images from the  procedure on the computer.  He recommended giving the patient another 2-  4 weeks to see if his pain will continue to improve.  If not, a bone  scan could be considered to look for a new fracture.   The patient is due to see Dr. Wynetta Emery in his office tomorrow.  He  apparently has been scheduled for an x-ray prior to that visit.  We will  see what Dr. Lonie Peak opinion is regarding the patient's intermittent and  ongoing back pain.   Greater than 25 minutes were spent on this follow-up visit.      Delton See, P.A.    ______________________________  Grandville Silos. Corliss Skains, M.D.    DR/MEDQ  D:  05/04/2008  T:  05/04/2008  Job:  [id number]   cc:   Donalee Citrin, M.D.  Doylene Canning. Ladona Ridgel, MD  Dr. Joycelyn Man

## 2011-05-08 NOTE — Consult Note (Signed)
[name], [name]                  ACCOUNT NO.:  [id number]   MEDICAL RECORD NO.:  [id number]          PATIENT TYPE:  INP   LOCATION:  3012                         FACILITY:  [facility]   PHYSICIAN:  Veverly Fells. Excell Seltzer, MD  DATE OF BIRTH:  November 05, 1924   DATE OF CONSULTATION:  DATE OF DISCHARGE:                                 CONSULTATION   REASON FOR CONSULTATION:  Atrial fibrillation and need for chronic  Coumadin.   HISTORY OF PRESENT ILLNESS:  [name] is an 77 year old gentleman who  is followed by Dr. Eden Emms and Dr. Ladona Ridgel for paroxysmal atrial  fibrillation.  He has had sick sinus syndrome and has undergone  permanent pacemaker placement.  [name] has been plagued with severe  low back pain over the last several months.  He has been diagnosed with  vertebral compression fractures and he may undergo vertebroplasty.  We  were asked to see him to determine whether his Coumadin could be safely  held in the periprocedural period.   Most of the history is obtained from [name]'s wife.  He denies chest  pain or dyspnea, but is unable to provide much detailed history.  His  wife reports that he has been stable from a cardiac standpoint over the  last several months.  He has not had orthopnea, PND, edema, or other  cardiac complaints.  He has no past history of stroke or TIA.  He has  been in severe pain and has required significant narcotic intake to  manage his pain.  He has quit eating and drinking and has become less  responsive.  He has been nearly bedridden over the last several days due  to his back problems.   CURRENT MEDICATIONS:  1. Amiodarone 100 mg b.i.d.  2. Omeprazole 20 mg daily.  3. Ditropan 5 mg b.i.d.  4. Aspirin 81 mg daily.  5. Coumadin as directed.  6. Multiple vitamins.  7. Potassium.  8. Atenolol 50 mg daily.  9. Zantac 150 mg daily.  10.Gabapentin 100 mg.  11.Simvastatin 40 mg.  12.Humibid 400 mg p.r.n.  13.Darvocet and Lortab as needed.   ALLERGIES:  Include penicillin and erythromycin.   SOCIAL HISTORY:  The patient is married.  He lives between [place].  He is retired from work in the [employer].  He does not  drink alcohol.  He quit tobacco in 1972.   FAMILY HISTORY:  Mother died at age 36 of congestive heart failure.  Father died in his 56s of coronary artery disease.   PAST MEDICAL HISTORY:  Pertinent for paroxysmal atrial fibrillation  maintained in sinus rhythm on amiodarone.  He has tachy-brady syndrome  and has been treated with dual-chamber permanent pacemaker.  His LVEF  has been preserved.  He has a history of anemia, colon polyps  gastroesophageal reflux disease, hypertension and dyslipidemia.   PAST SURGICAL HISTORY:  Includes, colon surgery, surgery for  nephrolithiasis, hernia repair, remote appendectomy.   REVIEW OF SYSTEMS:  Difficult to obtain, but chronic exertional dyspnea  as noted urinary incontinence and occasional urgency, back  pain as noted  in the HPI and nausea.   PHYSICAL EXAMINATION:  GENERAL:  The patient is somnolent but easily  arousable.  He does not appear to be in acute distress.  He answers  simple questions appropriately.  HEENT:  Normal.  NECK:  Normal carotid upstrokes without bruits.  Jugular venous pressure  is normal.  No thyromegaly or thyroid nodules.  LUNGS:  Showed decreased breath sounds bilaterally.  There are no rales  or rhonchi.  CARDIOVASCULAR:  Now the apex is discreet.  HEART:  Regular rate and rhythm.  There are no murmurs or gallops.  There is no right ventricular heave or lift.  ABDOMEN:  Soft, nontender, positive bowel sounds.  BACK:  No CVA tenderness.  EXTREMITIES:  There is no clubbing, cyanosis or edema.  Peripheral  pulses are intact and equal.  SKIN:  Warm and dry without rash.   Labs are pending.  Chest x-ray is pending.  EKG is also pending.   ASSESSMENT:  This is a 77 year old gentleman with paroxysmal atrial  fibrillation,  maintained on chronic anticoagulation with Coumadin.  He  has a vertebral compression fractures as noted in the HPI.  It is  perfectly reasonable to hold his Coumadin for planned for  vertebroplasty.  His risk of the cardioembolic stroke over a finite time  period is quite low in setting of no prior TIA or stroke and preserved  LV function.  He clearly needs to be worked up for his altered mental  status, which may be due to dehydration or subclinical infection.  I see  that the medicine consult has been placed for further evaluation.   We will be happy to follow along from a cardiac standpoint that to make  sure that he remains stable with regard to his atrial fibrillation.  We  would recommend continuing amiodarone and his other cardiac medications.      Veverly Fells. Excell Seltzer, MD  Electronically Signed     MDC/MEDQ  D:  04/12/2008  T:  04/13/2008  Job:  [id number]

## 2011-05-08 NOTE — Consult Note (Signed)
[name], [name]                  ACCOUNT NO.:  [id number]   MEDICAL RECORD NO.:  [id number]          PATIENT TYPE:  OUT   LOCATION:  XRAY                         FACILITY:  [facility]   PHYSICIAN:  Sanjeev K. Deveshwar, M.D.DATE OF BIRTH:  22-Jun-1924   DATE OF CONSULTATION:  05/18/2009  DATE OF DISCHARGE:  05/18/2009                                 CONSULTATION   CHIEF COMPLAINT:  Compression fractures.   HISTORY OF PRESENT ILLNESS:  This is a pleasant 77 year old man referred  to Dr. Corliss Skains initially in April 2009 for evaluation and treatment of  compression fractures.  The patient has osteoporosis, and on April 20, 2008, he underwent a vertebroplasty at L4 as well as a kyphoplasty at T9  performed by Dr. Corliss Skains.  The patient eventually had good relief of  his pain.  He had done well until April of this year when he developed  new back pain.  The patient is unable to have an MRI secondary to a  permanent pacemaker.  He did have a CT scan performed on April 19, 2009,  that showed a new severe fracture at T11 as well as a fracture at L3.  The patient was once again referred to Dr. Corliss Skains through the  courtesy of Dr. Wynetta Emery.  The patient presents today accompanied by his  wife for this visit.   Past medical history is significant for chronic atrial fibrillation.  The patient had been on Coumadin therapy in the past, although this was  stopped I believe secondary to some GI bleeding.  His cardiologist is  Dr. Lewayne Bunting.  The patient has a history of tachy-brady syndrome.  He is status post placement of a permanent pacemaker.  The patient has a  history of carotid artery disease on March 30, 2009.  He had a carotid  endarterectomy performed by Dr. Hart Rochester on the left.  The patient is also  noted to have a small abdominal aortic aneurysm which is being followed,  it was last noted to be approximately 3.1 cm by ultrasound on March 08, 2009.  The patient has a history of  hypertension, dyslipidemia, anemia,  and colon polyps.   Surgical history is significant for:  1. Colon surgery.  2. Surgery for renal calculi.  3. He is status post hernia repair.  4. He has had a remote appendectomy.  5. He has had cataract surgery.  6. He recently had a left carotid endarterectomy.   CURRENT MEDICATIONS:  Amiodarone, omeprazole, Ditropan, Tylenol p.r.n.,  aspirin 81 mg daily, multivitamin daily, fish oil, folic acid, potassium  supplement, guaifenesin, Zocor, Neurontin, atenolol, calcium, and he has  been using lidocaine patches for his back pain.   ALLERGIES:  The patient is allergic to PENICILLIN, STREPTOMYCIN, and  ERYTHROMYCIN.   SOCIAL HISTORY:  The patient is married.  He and his wife live in  [place], but they spend most of their time here in [place] with  their son.  They also have 1 adopted daughter.  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  He does not use  alcohol.  He quit smoking in 1972.   FAMILY HISTORY:  His mother died at age 57 from congestive heart  failure.  His father died in his 40s from coronary artery disease.   IMPRESSION AND PLAN:  The patient presents today for further evaluation  of new compression fractures by CT scan on April 19, 2009.  He has a  severe fracture at T11 as well as a less severe fracture at L3.  Dr.  Corliss Skains reviewed the images from the CT scan with the patient and his  wife.  The patient reports that his pain is about a 5 on a 1-10 scale  when he uses the lidocaine patches and Tylenol.  Apparently, he cannot  take narcotic medications secondary to mental status changes that he has  been limiting his activity.  He is able to find some relief with certain  sitting or lying positions.  The pain returns with any type of movement.   Once again, we discussed treatment options with the patient which  include conservative management with continued pain medications and  immobility versus the vertebroplasty or  kyphoplasty procedures.  Dr.  Corliss Skains felt that the patient would be a candidate for vertebroplasty  at T11 and a kyphoplasty at L3.  We discussed these options with the  patient and his wife and have tentatively scheduled him for Thursday,  May 26, 2009, at 12:30.  They are due to see Dr. Donalee Citrin this coming  Friday and would like to discuss the situation further with Dr. Wynetta Emery  prior to proceeding.   ADDENDUM:  The patient's wife also reports that the patient has been  started on calcitonin.   Greater than 30 minutes was spent on this evaluation.      Delton See, P.A.    ______________________________  Grandville Silos. Corliss Skains, M.D.    DR/MEDQ  D:  05/18/2009  T:  05/19/2009  Job:  [id number]   cc:   Donalee Citrin, M.D.  Hal T. Stoneking, M.D.

## 2011-05-08 NOTE — Discharge Summary (Signed)
NAMEMANASES, ETCHISON                  ACCOUNT NO.:  [id number]   MEDICAL RECORD NO.:  [id number]          PATIENT TYPE:  INP   LOCATION:  3012                         FACILITY:  [facility]   PHYSICIAN:  Donalee Citrin, M.D.        DATE OF BIRTH:  1924/08/13   DATE OF ADMISSION:  04/12/2008  DATE OF DISCHARGE:  04/21/2008                               DISCHARGE SUMMARY   ADMITTING DIAGNOSIS:  T9-L4 compression fractures with intractable back  pain.   DISCHARGE DIAGNOSES:  T9-L4 compression fractures with intractable back  pain in addition to urinary tract infection.   HISTORY OF PRESENT ILLNESS:  The patient is a very pleasant 77 year old  gentleman with longstanding multiple medical problems to include cardiac  dysrhythmias as well as hypertension.  The patient presented to the  emergency room with severe intractable back pain, was evaluated in the  emergency room by Neurosurgery, Internal Medicine, and Cardiology.  The  patient was taken off his Coumadin, was stabilized, and was evaluated by  neurology and found to be  neurologically intact.  Plain films showed  subacute compression fractures of T9 and L4.  The patient was admitted  off his Coumadin and undergo vertebroplasty for management of his  fracture.  The patient was admitted to the floor.  He was seen by  Cardiology who felt that the patient was safe to be taken off his blood  thinners.  The patient was very confused on admission.  This was felt to  be medication related, so all of his medications were stopped, withheld,  especially the pain medication __________  he had been placed on.  Over  the next couple of days, the patient's confusion resolved.  Initial  workup also showed a urinary tract infection.  The patient was placed on  IV Cipro initially to which the patient responded very well in about 24-  48 hours. After being placed on IV  Cipro and holding some of his pain  medications, his mental status has significantly improved.   The patient  progressively mobilized but was noted to have symptomatic intractable  back pain from the compression fractures.  The patient was ultimately  cleared and underwent vertebroplasty at these levels.  The patient did  very well, had significant improvement of his back pain.  The patient  increased his p.o. intake, stable from cardiac perspective, was  progressively mobilized with physical and occupational therapy and  patient was able to be subsequently discharged home on hospital day #10,  neurologically stable, ambulating and voiding spontaneously.  He was  then discharged with scheduled followup with both Internal Medicine,  Cardiology, and Neurosurgery and the patient was set up with home  health, occupational, and physical therapy.           ______________________________  Donalee Citrin, M.D.     GC/MEDQ  D:  05/12/2008  T:  05/13/2008  Job:  [id number]

## 2011-05-08 NOTE — Discharge Summary (Signed)
[name], EVERY                  ACCOUNT NO.:  [id number]   MEDICAL RECORD NO.:  [id number]          PATIENT TYPE:  INP   LOCATION:  3017                         FACILITY:  [facility]   PHYSICIAN:  [name], M.D.DATE OF BIRTH:  06-30-1924   DATE OF ADMISSION:  06/05/2009  DATE OF DISCHARGE:                               DISCHARGE SUMMARY   ATTENDING PHYSICIAN:  Willie Espy, MD   PRIMARY CARE PHYSICIAN:  Willie T. Stoneking, MD   CONSULTANTS DURING THIS CASE:  1. Willie K. Corliss Skains, MD, Interventional Radiology.  2. Willie Palau, MD, Neurology.   DISCHARGE DIAGNOSES:  1. Subarachnoid hemorrhage, felt due to complications.  2. Kyphoplasty  3. Secondary headache.  4. Altered mental status secondary to subarachnoid hemorrhage plus      medications for pain plus some sleep deprivation.  5. History of atrial fibrillation.  6. History of hyponatremia.  7. History of vasculopathy and carotids artery stenosis.   DISCHARGE MEDICATIONS:  The patient will discontinue all of his previous  medications, these are as follows:  1. Amiodarone 100 b.i.d.  2. Aspirin 81.  3. Atenolol 50.  4. Calcium 600.  5. Folate 1000.  6. Gabapentin.  7. Neurontin 300 t.i.d.  8. Iron tablet 1.  9. Prilosec 20 b.i.d.  10.Oxybutynin 5 b.i.d.  11.Zocor 20 nightly.  12.Percocet 5/225 p.o. q.6 h. p.r.n.   HOSPITAL COURSE:  The patient is an 77 year old white male with past  medical history of recent kyphoplasty as well as history of CAD, who  presented with a worsening headache that has been going on for several  days after his kyphoplasty and been continuous ever since.  The headache  was described as a band-like sensation around his head, going down his  neck.  CT scan of the head were unremarkable for any type of acute CVA.  He was noted to also have some hematuria on his initial exam.  When he  came into the emergency room, he was noted to be slightly dehydrated.  The patient was  started on IV fluids.  Interventional Radiology was  consulted.  In the interim, the patient also was noted to have a fever  of 101.4 on the 14th.  Neurology was consulted as well.  They felt that  subarachnoid hemorrhage could be a possibility as well as infectious  etiology and temporal arteritis, they recommended a lumbar puncture  which noted gross amount of bloody fluid concerning for subarachnoid  hemorrhage.  It was suspected that this was a venous bleed associated  with his vertebroplasty.  A CT angio of the head was done, and when his  CSF was noted to have RBCs around 30,000, this was consistent with that.  A CT angio was done which showed a right posterior communicating artery  infundibulum, but otherwise essentially unremarkable.  No signs of any  large aneurysm and the patient was started to show some signs of  improvement.  This was discussed with the patient and his family and the  plan will be that over the next several weeks he should continue  to  improve.  He was markedly deconditioned.  Home health PT/OT will follow  the patient at home.  He will continue on his medicines for pain p.r.n.  He had some episodes of confusion during his hospitalization and  increase somnolence, more and more he was sleeping during the day, awake  at night, getting further confused especially after receiving IV pain  medication and the plan is that this should improve greatly as the  patient is more in a familiar home environment, sleeping during the  night, not during the day.  Family is comfortable with this plan.   DISCHARGE DIET:  Heart-healthy diet.   ACTIVITY:  As per home health PT/OT.   He will follow with Dr. Merlene Laughter, his PCP in 1-2 weeks after  discharge.      [name], M.D.  Electronically Signed     SKK/MEDQ  D:  06/09/2009  T:  06/10/2009  Job:  [id number]   cc:   [name], M.D.  [name], M.D.  [name], M.D.

## 2011-05-08 NOTE — Procedures (Signed)
DUPLEX ULTRASOUND OF ABDOMINAL AORTA   INDICATION:  Followup, abdominal aortic aneurysm.   HISTORY:  Diabetes:  No.  Cardiac:  Pacemaker.  Hypertension:  No.  Smoking:  Quit.  Connective Tissue Disorder:  Family History:  No.  Previous Surgery:  No.   DUPLEX EXAM:         AP (cm)                   TRANSVERSE (cm)  Proximal             1.44 cm                   1.67 cm  Mid                  2.07 cm                   2.03 cm  Distal               2.95 cm                   3.19 cm  Right Iliac          2.64 cm                   2.68 cm  Left Iliac           1.32 cm                   1.30 cm   PREVIOUS:  Date: 07/15/07  AP:  3.1  TRANSVERSE:  3.3   IMPRESSION:  1. Abdominal aortic aneurysm measurements appear fairly stable      compared to previous study done at [facility] on      07/15/07 with today's largest measurement of 2.95 cm X 3.19 cm.  2. 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   ___________________________________________  Quita Skye. Hart Rochester, M.D.   AS/MEDQ  D:  03/08/2009  T:  03/08/2009  Job:  [id number]

## 2011-05-08 NOTE — Op Note (Signed)
[name], [name]                  ACCOUNT NO.:  [id number]   MEDICAL RECORD NO.:  [id number]          PATIENT TYPE:  INP   LOCATION:  3305                         FACILITY:  [facility]   PHYSICIAN:  Quita Skye. Hart Rochester, M.D.  DATE OF BIRTH:  Oct 29, 1924   DATE OF PROCEDURE:  03/30/2009  DATE OF DISCHARGE:                               OPERATIVE REPORT   PREOPERATIVE DIAGNOSIS:  Severe left internal carotid  stenosis/asymptomatic.   POSTOPERATIVE DIAGNOSIS:  Severe left internal carotid  stenosis/asymptomatic.   OPERATION:  Left carotid endarterectomy with Dacron patch angioplasty.   SURGEON:  Quita Skye. Hart Rochester, MD   FIRST ASSISTANT:  Jerold Coombe, PA   ANESTHESIA:  General endotracheal.   BRIEF HISTORY:  This patient has been followed in [facility] by Dr. Mellody Life for carotid occlusive disease and small abdominal aortic  aneurysm.  He recently moved to [facility] and repeat duplex scan at  this point shows progression of his left carotid disease to 80-85%  severity.  He has no symptoms.  He has a mild right internal carotid  stenosis.  Scheduled now for left carotid endarterectomy/asymptomatic  lesion.   PROCEDURE:  The patient was taken to the operating room, placed in  supine position at which time satisfactory general endotracheal  anesthesia was administered.  Left neck was prepped, Betadine scrub and  solution, draped in routine sterile manner.  An incision was made along  the anterior border of the sternocleidomastoid muscle and carried down  through subcutaneous tissue and platysma using the Bovie.  The common  facial vein and external jugular veins were ligated with 3-0 silk ties  and divided exposing the common internal and external carotid arteries.  Care was taken not to injure the vagus or hypoglossal nerves, both of  which were exposed.  There is a calcified atherosclerotic plaque at the  carotid bifurcation extending up about 2 cm, distal vessel appeared  normal.   A #10 shunt was prepared and the patient was heparinized.  Carotid vessels were occluded with vascular clamps, longitudinal opening  made in the common carotid with 15 blade, extended up the internal  carotid with the Potts scissors to a point distal to the disease.  Plaque was about 80-90% stenotic in severity and distal vessel appeared  normal.  A #10 shunt inserted without difficulty reestablishing flow in  about 2 minutes.  Standard endarterectomy was then performed using the  elevator and Potts scissors with an eversion endarterectomy of the  external carotid.  The plaque feathered off distal internal carotid  artery nicely not requiring any tacking sutures.  The lumen was  thoroughly irrigated heparin saline.  All loose debris carefully removed  and arteriotomy was closed with a patch using continuous 6-0 Prolene.  Prior to completion of the closure, shunt was removed after about 30  minutes shunt time following antegrade and retrograde flushing, closure  was completed reestablishing flow initially up the external  and internal branch.  Carotid was occluded for less then 2 minutes for  removal of the shunt.  Protamine was then given to reverse  the heparin  following adequate hemostasis.  Wound was irrigated with saline, closed  in layers with Vicryl in subcuticular fashion.  Sterile dressing  applied.  The patient was taken to recovery in satisfactory condition.      Quita Skye Hart Rochester, M.D.  Electronically Signed     JDL/MEDQ  D:  03/30/2009  T:  03/31/2009  Job:  [id number]

## 2011-05-08 NOTE — H&P (Signed)
[name], [name]                  ACCOUNT NO.:  [id number]   MEDICAL RECORD NO.:  [id number]          PATIENT TYPE:  INP   LOCATION:  3017                         FACILITY:  [facility]   PHYSICIAN:  [name], M.D.DATE OF BIRTH:  November 07, 1924   DATE OF ADMISSION:  06/05/2009  DATE OF DISCHARGE:                              HISTORY & PHYSICAL   ATTENDING PHYSICIAN:  [name], M.D.   PRIMARY CARE PHYSICIAN:  Hal T. Stoneking, M.D.   HOSPITAL CONSULTATIONS:  Grandville Silos. Corliss Skains, M.D.   CHIEF COMPLAINT:  Headache.   HISTORY OF PRESENT ILLNESS:  The patient is an 77 year old white male  with past medical history of he is status post a T11 kyphoplasty done  approximately 8 or 9 days ago.  He initially did well but then after  several days started having complaints of increased lower back pain in  approximately the same area and as well as severe headache.  The  headache was described as occurring like a band around his head and  radiating down the back of his head down to his neck.  The pain was  relentless.  Between the two he followed up and had a CT of the lumbar  spine done which basically noted no evidence of any new compression  fractures, the interval kyphoplasty at L3 and some anterolisthesis  unchanged at L5-S1.  Because the patient's headache continued to  progress and actually got worse he came into the emergency room for  further evaluation at [facility].  He has no evidence of any  acute intracranial abnormalities.  According to his family they had  tried Darvocet and Endocet today, no relief.  In the emergency room the  patient was noted to have an elevated blood pressure of 172 with a  stable heart rate.  He received Compazine, Toradol, Benadryl, IV fluids,  oxygen with no relief.  Because of the no relief in his headache and the  recent surgeries he was then sent over as a direct admission to [facility].  When I saw the patient he was hard of  hearing but continued to  complain of severe headache, again described as a band going around both  sides of the head radiating down the back of his head down to his neck.  He described a little bit of associated blurry vision but no other  associated symptoms.  No dysarthric speech, swallowing issues, weakness  on one side of his body or the other.  He otherwise had a negative  review of systems.  No chest pain, shortness of breath, wheezing,  coughing, abdominal pain.  He had noted no hematuria or dysuria.  No  constipation, diarrhea.  No focal extremity numbness, weakness or pain.  Review of systems otherwise negative.  He was noted at the ER that his  only other abnormal lab was some gross hematuria.   PAST MEDICAL HISTORY:  The patient has bilateral carotid artery stenosis  40-60% on the right, 60-80% on the left.  He is status post carotid  endarterectomy.  He also  has a history of atrial fibrillation, lumbar  spine problems, permanent pacer, shoulder surgery, bilateral cataract  surgery, hernia repair, stable small abdominal aortic aneurysm at 3.19  cm being followed, distant history of tobacco abuse.  He is status post  a kyphoplasty done on May 27, 2009.   MEDICATIONS:  1. The patient is on amiodarone 100 b.i.d.  2. Aspirin 81.  3. Atenolol 50.  4. Calcium 600.  5. Folate one.  6. Neurontin 300 t.i.d.  7. Iron.  8. Prilosec 20 b.i.d.  9. Oxybutynin 5 b.i.d.  10.Zocor 20.  11.Percocet p.r.n.   ALLERGIES:  PENICILLIN, STREPTOMYCIN, ERYTHROMYCIN.   SOCIAL HISTORY:  Denies any current tobacco, alcohol or drug use.   FAMILY HISTORY:  Noncontributory.   PHYSICAL EXAM:  VITAL SIGNS:  The patient's vitals on admission to the  floor temperature 97, heart rate 61, blood pressure 172/82, respirations  20, O2 sat 98% on room air.  GENERAL:  He is alert and oriented x3.  HEENT:  Normocephalic atraumatic.  His mucous membranes are slightly  dry.  NECK:  He has no carotid  bruits.  Cranial nerves II-XII are relatively  intact.  HEART:  Regular rate and rhythm, S1-S2.  LUNGS:  Clear to auscultation bilaterally.  ABDOMEN:  Soft, nontender, nondistended.  Positive bowel sounds.  EXTREMITIES:  Show no clubbing, cyanosis or edema.  I cannot appreciate  any focal neurological deficits.   LAB:  Sed rate is normal at 5.  Urinalysis of 21-50 red cells with large  blood, 30 of proteins.  CPK cardiac markers are normal.  BMET is  significant for slightly elevated BUN of 38, a creatinine of 1.3.  LFTs  normal including albumin of 4.  Coags normal.  White count 7.9, no  shift.  H and H 11 and 35.  MCV 105.  Platelet count 195.   ASSESSMENT AND PLAN:  1. Intractable headache.  Difficult to say if this is more of a stress      related headache.  Given his failure of nonnarcotic medications in      the emergency room will try some IV morphine.  May be stress      related secondary to the increased back pain.  I doubt very much      this is stroke related.  CT is negative of his head and we cannot      check an MRI because of his pacemaker.  Plan, continue to monitor.      Try humidified O2, IV morphine, continuous home medications.  2. Hematuria.  A recent urinalysis done several months prior noted      some mild hematuria and a CT of the abdomen and pelvis done      approximately 2-1/2 weeks ago noted while he does have a common      iliac artery aneurysm it was also noted that he has a      nonobstructing 6 x 10 mm left ureterovesical stone which has likely      been the cause of this gross hematuria.  3. History of vasculopathy including carotid artery stenosis.  Again,      no other focal findings for stroke noted, the      mild aneurysm but these are not the symptoms he is complaining      about and these are stable issues.  4. Recent T11 kyphoplasty.  If we are not unable to get his headache      under control we  will consult Dr. Corliss Skains of interventional       radiology to see if they have any additional insights.      [name], M.D.  Electronically Signed     SKK/MEDQ  D:  06/05/2009  T:  06/05/2009  Job:  [id number]   cc:   Hal T. Stoneking, M.D.  Sanjeev K. Corliss Skains, M.D.

## 2011-05-08 NOTE — Procedures (Signed)
CAROTID DUPLEX EXAM   INDICATION:  Followup carotid disease.   HISTORY:  Diabetes:  No.  Cardiac:  Pacemaker.  Hypertension:  No.  Smoking:  Previous.  Previous Surgery:  Left carotid endarterectomy 03/30/2009.  CV History:  Asymptomatic.  Amaurosis Fugax No, Paresthesias No, Hemiparesis No                                       RIGHT             LEFT  Brachial systolic pressure:         140               143  Brachial Doppler waveforms:         Triphasic         Triphasic  Vertebral direction of flow:        Antegrade         Antegrade  DUPLEX VELOCITIES (cm/sec)  CCA peak systolic                   99                76  ECA peak systolic                   93                111  ICA peak systolic                   116               111  ICA end diastolic                   38                33  PLAQUE MORPHOLOGY:                  Mixed             Intimal thickening  PLAQUE AMOUNT:                      Moderate          Mild  PLAQUE LOCATION:                    Bifurcation, ICA  CCA, ICA   IMPRESSION:  1. 40%-59% stenosis in the right internal carotid artery (low end of      range).  2. Patent left internal carotid artery post carotid endarterectomy,      with minimal intimal thickening.        ___________________________________________  Quita Skye. Hart Rochester, M.D.   CJ/MEDQ  D:  12/27/2009  T:  12/28/2009  Job:  [id number]

## 2011-05-08 NOTE — Assessment & Plan Note (Signed)
OFFICE VISIT   Willie, BARTOLUCCI Reynolds  DOB:  1924/07/28                                       12/27/2009  ZOXWR#:[id number]   The patient is here today for surveillance carotid duplex of his carotid  arteries.  He is status post left carotid endarterectomy on March 30, 2009 by Dr. Josephina Gip for severe left internal carotid artery  stenosis which was asymptomatic.  He also has had mild stenosis on the  contralateral right internal carotid.  He has been doing well since  surgery denying any neurologic symptoms such as visual changes,  dysphagia, true dysarthria or unilateral weakness or known TIA or  stroke.  He reports that he has some problems with short-term memory.  His wife also reported that when he was seeing the pain doctor she heard  mentioned that they thought he might be dragging his right leg although  the patient or his wife have not necessarily noticed this.  He does  ambulate with a cane.   PAST MEDICAL HISTORY:  Is significant for an atrial fibrillation, lumbar  spinal stenosis, permanent pacemaker, cataract surgery as well as eyelid  surgery for drooping.  He has also had a hernia repair and prostate  surgery as well as permanent pacemaker insertion.  He also has a known  small abdominal aortic aneurysm last measured 3.19 cm per ultrasound on  March 08, 2009 and at that time had a new finding of a right iliac  artery aneurysm measuring 2.68 cm.   He is married with two children.  He is retired and has not smoked since  1972 and does not drink alcohol on a regular basis.   REVIEW OF SYSTEMS:  He reports weight loss, loss of appetite as well as  problems with reflux and constipation.  He recently had a GI bleed in  June of this year felt related to diverticula.  He had occasionally also  has some bright red blood in his stools, and has seen Dr. Christella Hartigan for  this.  He also reports pain in his feet with lying flat and pain in his  back when lying  flat.  He denies any foot ulcerations.  He does have  generalized arthritic pain and problem with anxiety.  He does were  eyeglasses.   PHYSICAL EXAM:  VITAL SIGNS:  Blood pressure is 118/71, heart rate 74,  temperature 98.4.  The patient appears his stated age.  He is relatively  thin, he is wearing glasses.  LUNGS:  Lung sounds are clear.  There is no labored breathing.  HEART:  Heart sounds were diminished, but was relatively regular.  No  peripheral edema or carotid bruits were auscultated.  His left neck  shows a well-healed left carotid endarterectomy incision site without  erythema or drainage.  ABDOMEN:  Soft and nontender with hypoactive bowel sounds.  He does have  palpable abdominal aortic aneurysm that was nontender to touch.  NEUROLOGY:  Nonfocal.  He was somewhat hard of hearing.  Tongue was  midline and facial symmetry.  His grips were strong bilaterally.  His  plantar flexion of his lower extremities as well as leg adduction and  abduction were strong.  EXTREMITIES:  Show palpable femoral pulses bilaterally and posterior  tibial Doppler signals bilaterally.   The patient had carotid duplex exam today with results showing  40%-59%  stenosis on the right which was felt to be in the lower end of the range  with a patent left internal carotid artery post carotid endarterectomy  with minimal intimal thickening.  His disease on the right was stable.  Therefore, it is felt that he could be seen in 6 months for repeat  carotid duplex if stable at that time he can go to yearly carotid duplex  exams.  At the next visit we will also reevaluate his abdominal and  iliac artery aneurysms.  He was told that he should go to the emergency  department if he does experience any severe abdominal or back pain in  the meantime.   CURRENT MEDICATIONS:  1. Amiodarone 200 mg b.i.d.  2. Oxybutynin 5 mg one in a.m., 1/2 in p.m.  3. Atenolol 50 mg p.o. daily.  4. Neurontin 300 mg one p.o.  t.i.d.  5. Simvastatin 40 mg 1/2 tablet daily.  6. Aspirin 81 mg p.o. daily.  7. Mucinex 600 mg b.i.d.  8. Omeprazole 20 mg 2 tablets daily.  9. Multivitamin daily.  10.Probiotics daily.  11.Calcitonin 1 spray daily to one nostril.  12.Vitamin C daily.  13.Calcium 600 mg daily.  14.Vitamin D 400 units daily.  15.Ultram 50 mg q.6 h p.Reynolds.n. for pain.  16.Lexapro 10 mg 1/2 tablet daily.  17.Tylenol 325 mg t.i.d.   Jerold Coombe, PA   [name], M.D.  Electronically Signed   AWZ/MEDQ  D:  12/27/2009  T:  12/28/2009  Job:  [id number]   cc:   Hal T. Stoneking, M.D.

## 2011-05-08 NOTE — Consult Note (Signed)
[name], Reynolds                  ACCOUNT NO.:  [id number]   MEDICAL RECORD NO.:  [id number]          PATIENT TYPE:  INP   LOCATION:  3017                         FACILITY:  [facility]   PHYSICIAN:  Marlan Palau, M.D.  DATE OF BIRTH:  [birth year]   DATE OF CONSULTATION:  DATE OF DISCHARGE:                                 CONSULTATION   REASON FOR CONSULTATION:  Headache.   HISTORY OF PRESENT ILLNESS:  [name] is an 77 year old white man with  chronic atrial fibrillation, hypertension, and hyperlipidemia, status  post kyphoplasty at T11 and L3 for vertebral gross fracture.  He had the  procedure done on May 27, 2009.  He was admitted 1 day ago for headache.  Per the patient and his wife, headaches started right after the  procedure, but then he eased off a little bit and after a couple of days  it started again.  He did describe it as a band above the scalp that  radiates down to the neck all the way down to the spine.  It is  persistent and severe associated with nausea, but no vomiting.  No  photophobia.  No focal weakness or numbness or syncope.  No fever at  home, but in the hospital he has had fever up to 101.4.  No cough.  No  chest pain or bowel or bladder disturbances.  Headache is persistent  despite pain medicines.  He is currently getting p.r.n. morphine and  Percocet.  The pain medicines are helping only a little bit and it is  making him drowsy.  He denies any recent travels, bug bites, trauma, jaw  medications, visual problems, or systemic complaints.   ALLERGIES:  The patient is allergic to PENICILLIN, STREPTOMYCIN, and  ERYTHROMYCIN.   PAST MEDICAL HISTORY:  1. Status post kyphoplasty T11 and L3 on May 27, 2009, with Dr.      Corliss Skains.  2. Atrial fibrillation, off Coumadin.  3. Left internal carotid stenosis, status post endarterectomy in April      2010.  4. Permanent pacemaker insertion for tachy-brady syndrome in 2008.  5. Abdominal aortic aneurysm, 3 cm, in  March 2010.  6. Hypertension.  7. Hyperlipidemia.  8. Chronic anemia.  9. History of colonic polyps, colonoscopy 2007.  10.History of appendectomy.  11.History of inguinal hernia repair.  12.Nonischemic Myoview in 2006.   HOME MEDICATIONS:  1. Amiodarone 100 mg twice a day.  2. Atenolol 50 mg once daily.  3. Coumadin, currently discontinued.  4. Aspirin 81 mg once daily.  5. Calcium.  6. Folic acid.  7. Neurontin 300 three times a day.  8. Iron.  9. Prilosec.  10.Oxybutynin 5 twice a day.  11.Zocor 20 mg once a day.  12.Percocet as needed.   SOCIAL HISTORY:  The patient lives with son and wife.  He is married.  He does not have any history of alcohol or drug abuse.  He is an ex-  tobacco abuser.   FAMILY HISTORY:  Significant for CHF in mother who passed away at the  age of 29 and coronary  artery disease in father who passed away in his  64s.   REVIEW OF SYSTEMS:  Negative except per HPI.   PHYSICAL EXAMINATION:  VITAL SIGNS:  Temperature maximum 101.4, blood  pressure 153/72, heart rate 62, respiratory rate 18, and saturation 96%  on room air.  NEURO:  Alert and oriented, but the patient appears sleepy secondary to  medication.  No facial deviation.  Pupil are equal, round, and reactive  to light.  No icterus.  No pallor.  No neck stiffness.  Sensation is  intact.  Cranial nerves intact.  Motor function is intact.  Reflexes  diminished, but symmetrical.  Cerebellar function is intact.  CHEST:  Clear to auscultation bilaterally.  CARDIOVASCULAR:  Irregular rate and rhythm.  No murmur, rubs, or  gallops.  ABDOMEN:  Soft, nontender, and nondistended.  EXTREMITIES:  No edema, cyanosis, or clubbing.   LAB RESULTS:  Hemoglobin 11, WBC 7.5, platelet 159, and MCV 103.6.  INR  1.  Sodium 130, potassium 3.3, chloride 101, bicarbonate 22, BUN 20,  creatinine 1.07, and blood glucose 95.  Liver function tests normal  except for a protein of 5.18 and albumin 3.1.  Urinalysis  positive for  rbc 21-50.  Urine cultures negative.  CT of head negative for any acute  findings.  CT of lumbar spine negative for abscesses or anything acute.   ASSESSMENT/PLAN:  This is an 77 year old man with headache that  developed postprocedure.  It is possible that the patient could be  having a spinal headache after a spinal procedure.  The other things to  rule out are infectious etiologies related to the procedure as well as  given his age and risk factors we need to rule out temporal arteritis  and subarachnoid hemorrhage.  Other possibilities including tumors and  subdural are less likely given normal neuro exam and negative CT.  We  recommend lumbar puncture to rule out any infectious etiology and ESR.  We will follow the results and review.  If we rule out infectious  etiologies and subarachnoid hemorrhage as well as temporal arthritis, we  can manage his headaches symptomatically and consider blood patch.   Thank you very much for this interesting consult.      Zara Council, MD  Electronically Signed      Marlan Palau, M.D.  Electronically Signed    AS/MEDQ  D:  06/06/2009  T:  06/07/2009  Job:  [id number]

## 2011-05-08 NOTE — Discharge Summary (Signed)
NAMEALEXANDRA, [name]                  ACCOUNT NO.:  [id number]   MEDICAL RECORD NO.:  [id number]          PATIENT TYPE:  INP   LOCATION:  3305                         FACILITY:  [facility]   PHYSICIAN:  Quita Skye. Hart Rochester, M.D.  DATE OF BIRTH:  [birth year]   DATE OF ADMISSION:  03/30/2009  DATE OF DISCHARGE:  03/31/2009                               DISCHARGE SUMMARY   ADMISSION DIAGNOSIS:  Asymptomatic severe left internal carotid artery  stenosis.   DISCHARGE DIAGNOSES:  1. Asymptomatic severe left internal carotid artery stenosis, status      post left carotid endarterectomy.  2. History of atrial fibrillation.  3. Lumbar spine problems.  4. History of permanent pacemaker.  5. History of shoulder surgery.  6. History of bilateral cataract surgery.  7. History of hernia repair.  8. History of cataract surgery, left eye.  9. History of small abdominal aortic aneurysm, followed by Dr. Lawernce Ion      in [place], maximum diameter 3.19 cm per ultrasound on March 08, 2009, and a new finding of right iliac aneurysm measuring maximum      diameter of 2.68 cm per ultrasound on March 08, 2009.  10.Remote history of tobacco use.  11.Allergy to PENICILLIN and STREPTOMYCIN.   PROCEDURE:  On March 30, 2009, left carotid endarterectomy with Dacron  patch angioplasty by Dr. Josephina Gip.   BRIEF HISTORY:  Mr. Heeney is an 77 year old male who has been followed  by Dr. Lawernce Ion in [facility] for small abdominal aortic aneurysm and  asymptomatic carotid stenosis which is slowly progressed over the last 3-  4 years.  He has been asymptomatic.  Recent carotid duplex at the [facility]  office revealed progression of the disease to approximately 80-85% in  severity on the left.  On the right, it showed evidence of 40-59%  stenosis.  He also had abdominal ultrasound showing stable abdominal  aortic aneurysm measuring 2.95 x 3.19 cm and the right iliac artery  aneurysm measuring 2.64 x 2.68 cm.   HOSPITAL COURSE:  Mr.  Eisenhour was electively admitted to [facility] on March 30, 2009.  He underwent the previously mentioned  procedure.  Postoperatively, he was extubated neurologically intact and  after a short stay in recovery unit was ultimately transferred for to  Step-Down Unit 3300.  He overall had uneventful postoperative course.  However, rapid response was called around 5:45 on hospital day #1 noting  a change in his left pupil size.  It seemed sluggish and larger than the  right.  Otherwise, he remained neurologically intact.  When family  arrived, however, they were able to confirm that the pupil had not been  different.  He had actually had cataract surgery in the left eye and  this did not appear different.  He remained under close neurologic  evaluation, however.  On postoperative day #1, there were no changes.  Pupils were still slightly asymmetric.  Neck wound showed no signs of  hematoma.  Hemodynamically, he was stable.  His postoperative labs  showed a sodium of 139, potassium  4.1, glucose 99, BUN of 11, creatinine  1.02, white count of 6.7, hemoglobin 9.5, hematocrit 27.6, and platelet  count of 127.  That morning, he was able to ambulate, void, and tolerate  regular food and ultimately felt appropriate for discharge home on  postop day #1, March 31, 2009, in stable condition.   DISCHARGE MEDICATIONS:  1. Guaifenesin 400 mg 1 p.o. b.i.d.  2. Fish oil capsule 1200 mg p.o. daily.  3. Amiodarone 100 mg p.o. b.i.d.  4. Oxybutynin 5 mg p.o. b.i.d.  5. Atenolol 50 mg 1 p.o. q.p.m.  6. Gabapentin 300 mg 1 p.o. t.i.d.  7. Simvastatin 90 mg 1/2 tablet p.o. q.p.m. (however, simvastatin does      not come in this dose, so he can confirm his dose and continue his      home regimen.  Per [place] office note in March 2009, he was on 20      mg daily).  8. Miacalcin nasal spray 4 mg 1 spray daily.  9. Probiotic 6 mg 1 daily.  10.Ibuprofen 200 mg 3 tablets t.i.d.  11.Omeprazole 20 mg 1 p.o.  b.i.d.  12.Aspirin 81 mg p.o. daily.  13.Vitamin D 400 mg daily.  14.Calcium 600 mg daily.  15.Ultram 50 mg 1 tablet p.o. q.6 h. p.r.n. pain.   DISCHARGE INSTRUCTIONS:  To continue heart-healthy diet.  Increase his  activity as tolerated.  Avoid driving or heavy lifting for the next  couple of weeks.  May shower and clean his incisions gently with soap  and water.  He will see Dr. Hart Rochester in 2-3 weeks.  He can call sooner if  he has fever greater than 101, redness or drainage from incision site,  severe headache, or new neurologic changes.      Jerold Coombe, P.A.      Quita Skye Hart Rochester, M.D.  Electronically Signed    AWZ/MEDQ  D:  03/31/2009  T:  04/01/2009  Job:  [id number]   cc:   Doylene Canning. Ladona Ridgel, MD  Donalee Citrin, M.D.  Hal T. Stoneking, M.D.  Beverly Gust. Lawernce Ion, MD

## 2011-05-08 NOTE — H&P (Signed)
[name], [name]                  ACCOUNT NO.:  [id number]   MEDICAL RECORD NO.:  [id number]          PATIENT TYPE:  EMS   LOCATION:  MAJO                         FACILITY:  [facility]   PHYSICIAN:  Donalee Citrin, M.D.        DATE OF BIRTH:  [birth year]   DATE OF ADMISSION:  04/12/2008  DATE OF DISCHARGE:                              HISTORY & PHYSICAL   ADMIT DIAGNOSIS:  T9, L4 compression fractures with some increase in  confusion and poor p.o. intake.   HISTORY OF PRESENT ILLNESS:  The patient is a very pleasant 77 year old  gentleman who I saw in the office last Wednesday, who was complaining of  low back pain.  The patient was ambulatory at that time, awake, alert,  and very minimally confused, somewhat of a difficult historian, but is  seem to be appropriate.  However, over the last couple of days at home,  the patient has had increased levels of confusion; decreasing p.o.  intake, both in liquids and solids; complaining of increasing pain in  his back; and has been not compliant with getting out of bed and not  want to ambulate to the bathroom.  Actually, had to carry there for his  family.  The patient had been in process of getting worked up for his  back pain and compression fractures.  The patient underwent a lumbar CT,  however, they did not visualize the thoracic spine, which showed a L4  compression fracture without significant canal compromise with no  pathologic enhancement.  However, they did not visualize the T9  compression fracture.  Currently, the patient's family contacted me  earlier today, I sent him to the ER.  On evaluation again, the patient  was quite awake and alert, complaining of back pain, but not complaining  of leg pain.  However, again he is a poor historian and it is very  difficult to get history, although the family members do report, he has  had several episodes of incontinence at home, but this is probably more  related to pain, not wanting to get out  of bed, as opposed to active  neurologic bladder dysfunction, and they also report that he has taken  oral liquids for his medications with his pills, but not with  significant appetite.   PAST MEDICAL HISTORY:  Remarkable for a cardiac dysrhythmias.  He is  followed by Dr. Maurine Cane with [place] and Lewayne Bunting, MD.  He also  has a history of hypercholesterolemia, peptic ulcer disease, and some  neuropathy.   SURGICAL HISTORY:  Includes hernial surgery, appendectomy, and a  pacemaker placement.   He is allergic to penicillin.   CURRENT MEDICATIONS:  1. Amiodarone 200 mg half a tablet twice a day.  2. Omeprazole 20 mg 1 tablet twice a day.  3. Oxybutynin 5 mg, this apparently dosage varies by twice a day.  4. Folic acid 800 mcg __________ mg twice a day.  5. Glucosamine 1500 mg 1 tablet twice a day.  6. Iron 325 mg 1 tablet once a day.  7. Bayer aspirin/baby  aspirin 1 tablet once a day.  8. Fish oil 1200 mg 1 tablet once a day.  9. Multivitamin once a day.  10.Potassium chloride 10 mEq 1 tablet once a day.  11.Calcium 600 mg tablet once a day.  12.Coumadin 2 mg apparently alternate dosing.  13.Atenolol 50 mg once a day.  14.Ranitidine 150 mg once a day.  15.Gabapentin 100 mg nightly.  16.Simvastatin 40 mg once a day.  17.Guaifenesin 400 mg 1 tablet once a day.   PHYSICAL EXAMINATION:  On exam, the patient is awake and alert.  To  direct questioning, he is oriented x3.  Strength is 5/5.  In his upper  extremity, he has significant pain over his midback around T9 as well  around L4.  Lower extremities strength appears to be 5/5 in his  iliopsoas, quads, hamstrings, gastrocs, and EHL.  He has normal  symmetric reflex and sensation without pathologic reflex.  He has no  ankle clonus or upgoing toes.  Sensation was actually difficulty to  assess due to the patient is noncompliance with the exam.   ASSESSMENT:  An 77 year old gentleman with increase in confusion,  worsening  back pain, and nonambulatory getting out of bed, with increase  in the confusion, possibly dehydration.   PLAN:  Admit the patient, IV hydration, and check his blood work, check  thoracolumbar CT to evaluate for T9 compression fracture as well as L4  compression fracture.  The patient could be potentially a good candidate  for a vertebroplasty or kyphovertebroplasty.  However, we need to hold  the patient's Coumadin, we will be consulting Internal Medicine as well  as Cardiology for helping managing some of his medical conditions and  also the clearance to hold Coumadin for the procedure versus placing him  on heparin and stopping acutely for the procedure.  We will defer to  Cardiology's recommendations as such.  We will again check the blood  work to rule out urinary tract infection, also contributing to the  incontinence and the confusion.           ______________________________  Donalee Citrin, M.D.     GC/MEDQ  D:  04/12/2008  T:  04/13/2008  Job:  [id number]

## 2011-05-08 NOTE — Procedures (Signed)
CAROTID DUPLEX EXAM   INDICATION:  Follow up carotid artery disease.   HISTORY:  Diabetes:  No.  Cardiac:  Pacemaker.  Hypertension:  No.  Smoking:  Quit.  Previous Surgery:  No.  CV History:  No.  Amaurosis Fugax No, Paresthesias No, Hemiparesis No.                                       RIGHT             LEFT  Brachial systolic pressure:         145               147  Brachial Doppler waveforms:         WNL               WNL  Vertebral direction of flow:        Antegrade         Antegrade  DUPLEX VELOCITIES (cm/sec)  CCA peak systolic                   101               90  ECA peak systolic                   100               166  ICA peak systolic                   135               303  ICA end diastolic                   49                101  PLAQUE MORPHOLOGY:                  Heterogenous      Homogenous  PLAQUE AMOUNT:                      Mild              Moderate/severe  PLAQUE LOCATION:                    ICA/bifurcation   ICA/bifurcation   IMPRESSION:  1. Right internal carotid artery shows evidence of 40-59% stenosis.  2. Left internal carotid artery shows evidence of 60-79% stenosis.  3. Left internal carotid artery appears tortuous distally.  4. Left internal carotid artery velocities appear higher than previous      study done at [facility] on 07/15/07.   ___________________________________________  Quita Skye. Hart Rochester, M.D.   AS/MEDQ  D:  03/08/2009  T:  03/08/2009  Job:  [id number]

## 2011-05-08 NOTE — Assessment & Plan Note (Signed)
[facility]                            CARDIOLOGY OFFICE NOTE   [name], [name]                           MRN:          [id number]  DATE:04/29/2007                            DOB:          05/11/1924    HISTORY:  [name] is seen today at his request.  He has recently seen  Dr. Doylene Canning. [name], who undertook a pacer implantation for tachy/brady  syndrome.  The patient has had a variety of spells where his heart has  been out of rhythm and has been quite bothersome.  This actually tracks  quite accurately with his episodes of atrial fibrillation, as detected  by his pacemaker.   There are other concerns.  They include weakness and weight loss, which  I will defer to his primary physician.   MEDICATIONS:  Notable for amiodarone, recently up-titrated from 100 mg  to 200 mg a day.  He is not on other rate-controlling drugs.  He does  recall that he was on diltiazem in the past.  He does recall that he was  on atenolol in the past, but he does not remember whether they were  stopped because of ineffectiveness or because of side effects.   PHYSICAL EXAMINATION:  VITAL SIGNS:  Blood pressure 139/80, pulse 90.  LUNGS:  Clear.  HEART:  Sounds were irregular.   Interrogation of his pacemaker was as noted previously.   IMPRESSION:  [name] has paroxysmal atrial fibrillation with a rapid  ventricular response.  Heart rates were in the 130's by device  interrogation.   PLAN:  Rate control is essential.  Options include beta blockers,  calcium blockers and/or digoxin, in addition to his amiodarone.  I have  chosen to give him prescriptions for Inderal LA 80 mg, Nadolol 40 mg and  atenolol 50 mg, to try in random order.   He is to see Dr. Ladona Reynolds again in six weeks.   I also reviewed extensively with them the role of AV junction ablation.  Apparently his atrial fibrillation issue has been a problem for a long,  long time and the drugs and the rhythms  are both associated with  significant symptoms.  His wife was about willing to do the AV junction  ablation today.  I suspect that they will come to this at their next  visit.     Duke Salvia, MD, [facility]  Electronically Signed    SCK/MedQ  DD: 04/29/2007  DT: 04/29/2007  Job #: [id number]

## 2011-05-08 NOTE — Consult Note (Signed)
[name], [name]                  ACCOUNT NO.:  [id number]   MEDICAL RECORD NO.:  [id number]          PATIENT TYPE:  OUT   LOCATION:  XRAY                         FACILITY:  [facility]   PHYSICIAN:  Sanjeev K. Deveshwar, M.D.DATE OF BIRTH:  1924/06/18   DATE OF CONSULTATION:  07/08/2009  DATE OF DISCHARGE:  07/08/2009                                 CONSULTATION   CHIEF COMPLAINT:  Back pain.   HISTORY OF PRESENT ILLNESS:  This is a pleasant 77 year old male who was  referred to Dr. Corliss Skains through the courtesy of Dr. Wynetta Emery for  evaluation of back pain in May 2010.  The patient has a long history of  compression fractures and osteoporosis.  In April 2009, the patient had  a vertebroplasty at L4 as well as a kyphoplasty at T9.  In June 2009 the  patient had a kyphoplasty at the T10 level.  On May 27, 2009 he had a  kyphoplasty at the T11 and L3 levels.   Following the kyphoplasties on May 27, 2009 the patient had severe back  pain as well as a severe headache.  He went home after the procedure was  performed but was later admitted to [facility] through the  emergency department with a severe headache.  There was concern that the  patient may have a subarachnoid hemorrhage.  He was admitted to [facility] on June 05, 2009 by Dr. Rito Ehrlich.  A Neurology consult was obtained  from Dr. Lesia Sago.  A CT scan of the head was unremarkable for an  acute CVA.  A lumbar puncture was performed that did reveal some bloody  fluid which again raised the concern for a subarachnoid hemorrhage.  A  CT angiogram of the head was performed which showed a right posterior  communicating artery infundibulum but was otherwise essentially  unremarkable.  The patient did continue to improve.  Dr. Corliss Skains was  advised of this admission and reviewed the patient's studies in order to  try to ascertain the exact nature of the problem.  He had made several  requests that the patient and his wife return  for further discussions  however, the patient declined until recently and they returned today for  a followup visit as requested by Dr. Corliss Skains.   PAST MEDICAL HISTORY:  Significant for:  1. Chronic atrial fibrillation.  The patient had been on Coumadin in      the past.  He does have a history of GI bleeds and at some point      his Coumadin was stopped.  His cardiologist is Dr. Lewayne Bunting.  2. The patient has a history of tachy-brady syndrome.  He has a      permanent pacemaker.  3. He has a history of carotid artery disease and had a left carotid      endarterectomy performed March 30, 2009 by Dr. Hart Rochester.  The patient      was also noted to have a small abdominal aortic aneurysm which is      being followed.  4. He has a history  of hypertension.  5. Dyslipidemia.  6. Anemia.  7. Colon polyps.   SURGICAL HISTORY:  Significant for colon surgery, surgery for renal  calculi.  He is status post hernia repair.  He has had a remote  appendectomy.  He has had cataract surgery.  He had the left carotid  endarterectomy in April 2010.   ALLERGIES:  He is allergic to PENICILLIN, STREPTOMYCIN and ERYTHROMYCIN.   MEDICATIONS:  Please see the previous dictation dated May 18, 2009 for  list of the patient's medications.   SOCIAL HISTORY:  The patient is married.  He and his wife live in  [place] but spends a great deal of time with their son here in  [place].  They have an adopted daughter.  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  He does not use alcohol.  He quit smoking in 1972.   FAMILY HISTORY:  The patient's mother died at age 57 from congestive  heart failure.  His father died in his 64s from coronary artery disease.   IMPRESSION AND PLAN:  The patient returns today as requested by Dr.  Corliss Skains for further discussions regarding his kyphoplasty at the T11  and L3 levels which was performed on May 27, 2009.  The patient  apparently did not obtain relief initially following  the kyphoplasty as  he had done with his other interventions.  He was readmitted to [facility] on June 05, 2009 with a severe headache initially felt  secondary to a subarachnoid hemorrhage after a lumbar puncture revealed  a significant amount of blood.  No source of bleeding was ever found.  Dr. Corliss Skains did review all of the patient's images thoroughly.  He  also discussed this with one of the other radiologist.  It is Dr.  Fatima Sanger opinion that the patient had a venous bleed which occurred  during the kyphoplasty which resulted blood in the spinal canal.  This  caused extreme irritation with subsequent increased back pain as well as  severe headache pain.  The patient does feel that these symptoms are  getting better.  He takes Tylenol for pain.  Apparently narcotic pain  medications sedate him.  He cannot take nonsteroidal anti-inflammatory  drugs due to his history of GI bleeding.   Dr. Corliss Skains apologized for the complications following the  kyphoplasty.  He did explain that this was a very, very rare  complication, although he did not feel that the patient would have any  lasting deficits as a result.  The patient and his wife were  understanding.  All of their questions were answered.  Greater than 30  minutes was spent on this visit, although there was no charge for Dr.  Fatima Sanger services.  Further followup will be on a p.r.n. basis.      Delton See, P.A.    ______________________________  Grandville Silos. Corliss Skains, M.D.    DR/MEDQ  D:  07/08/2009  T:  07/09/2009  Job:  [id number]   cc:   Hal T. Pete Glatter, M.D.  Donalee Citrin, M.D.  Marlan Palau, M.D.

## 2011-05-08 NOTE — Assessment & Plan Note (Signed)
OFFICE VISIT   HENDRY, SPEAS R  DOB:  March 20, 1924                                       04/26/2009  NFAOZ#:[id number]   The patient is a status post left carotid endarterectomy on April 7 for  severe left internal carotid stenosis which is asymptomatic.  He has a  mild stenosis in his contralateral right internal carotid.  He has done  well following his surgery with no neurologic complications or specific  complaints.  He is swallowing well and has no hoarseness.  Denies any  hemispheric or nonhemispheric TIAs.  He takes one aspirin 81 mg per day.   PHYSICAL EXAM:  Blood pressure 100/60, heart rate 80, respirations 14.  Left neck incision is well-healed.  No bruits are audible.  He has a 3+  pulse on the left and right with a soft bruit on the right.   I think he is getting along nicely and I encouraged him to continue to  increase his activity as tolerated.  I plan to see him back in 6 months  with a followup carotid duplex exam at that time unless he develops any  neurologic symptoms in the interim.   Quita Skye Hart Rochester, M.D.  Electronically Signed   JDL/MEDQ  D:  04/26/2009  T:  04/27/2009  Job:  [id number]   cc:   Hal T. Stoneking, M.D.  Doylene Canning. Ladona Ridgel, MD  Dr Rosita Kea

## 2011-05-11 NOTE — Assessment & Plan Note (Signed)
[facility]                            CARDIOLOGY OFFICE NOTE   GARVIN, ELLENA                           MRN:          [id number]  DATE:02/13/2007                            DOB:          1924/08/07    [name] is an 77 year old patient referred by Dr. Donalee Citrin.  This is  for a 2nd opinion regarding his heart.  It took me about 20 minutes to  read through multiple records from [facility].   The patient's history of PAF goes back to probably 2001 or 2002.  He was  seen by a heart doctor in 2003.  He has never had a cardioversion or  sustained atrial fibrillation.  He gets palpitations and probable PAF  maybe as much as 2 times per week.  I have records which clearly show  atrial fibrillation at fairly high rates from as recent as December  2007.   Unfortunately, however, the patient also appears to have sick sinus  syndrome.  He has never had syncope or presyncope, but has significant  fatigue, which has worsened over the last 6 months.  He is currently in  sinus bradycardia in the mid to low 40s.   The patient had been started on Coumadin, however, he was just  hospitalized at [facility] for rectal bleeding.  He was there at the end of  January.   He saw our GI department, I believe he saw Dr. Christella Hartigan.  His hematocrit  was as low as 29.  He was discharged without baby aspirin or Coumadin.   Apparently, he did not have a full colonoscopy or EGD.   He was given fresh frozen plasma to reverse his Coumadin.   Since that time, his stools have been fine.  He has continued to have  fatigue.  He continues to notice his palpitations.   REVIEW OF SYSTEMS:  Otherwise, remarkable for history of a heart murmur,  occasional headaches.   HE IS ALLERGIC TO PENICILLIN.   He does not smoke; he quit in 1970.  He has hypercholesterolemia.  He  has no documented coronary disease.   He is retired from the [employer].  He is originally from [place].   He  has been happily married for 58 years.  His spouse and his son are with  him today.  He moved here to be closer to his son, who used to work Stage manager and is now an Cytogeneticist.  He has had  previous hernia repair, previous appendectomy and cataract surgery.   FAMILY HISTORY:  Noncontributory.   He is currently taking:  1. Potassium 10 a day.  2. Omeprazole 20 a day.  3. Gemfibrozil b.i.d.  4. Amiodarone 100 a day.  5. Warfarin has been stopped.  6. Simvastatin 20 a day.  7. Baby aspirin will be restarted.  8. Gabapentin 1 gm a day.   EXAMINATION:  His exam is remarkable for a slim, elderly gentleman, in  no distress.  Blood pressure is 130/70.  Pulse is 46 and regular.  HEENT:  Normal.  He has a right carotid bruit.  LUNGS:  Clear.  There is an S1 and S2 with a soft systolic murmur.  ABDOMEN:  Benign.  LOWER EXTREMITIES:  Intact pulses.  No edema.   His EKG shows sinus bradycardia without any acute changes.  His PR  interval was 162.   Reading through his records from [place], he has had a 60% stenosis in  the right carotid artery, which has been followed.  He is due to have a  followup Duplex this winter.   He has had a nonischemic Myoview in 2006.  He has had an echocardiogram  with good LV systolic function, and really no significant valvular heart  disease to speak of except for some mild MR.   The patient has had LFTs in April 2006, which were normal and no  evidence of amiodarone toxicity.   IMPRESSION:  I had a long discussion with [name] and his family.  The  issues are multifactorial.  He clearly is probably having paroxysmal  atrial fibrillation.  We really cannot treat this anymore aggressively  than his low-dose amiodarone given his severe bradycardia.  I would  agree to continue to keep his Coumadin on hold until he gets reevaluated  by Gastroenterology.  I think he deserves to have a full colonoscopy.  We will try to  arrange this.   In regards to his atrial fibrillation, it may very well be that if he  was not so bradycardic, his episodes of atrial fibrillation would be  less frequent.  Since he has significant fatigue, and he is 77 years old  with a resting heart rate of 46, I think he would benefit from pacemaker  placement.  I think the pacemaker would not only increase his activity  level, probably decrease the recurrence rate of his atrial fibrillation,  and also allow [place] to possibly treat him with either with antiarrhythmics  or other AV nodal blocking drugs.  We will try to get him in to see Dr.  Graciela Husbands or Dr. Ladona Ridgel as soon as possible.  I think he would be amenable  to having a pacemaker placed.   I do not think he needs any further workup, since he has had fairly  recent echocardiograms and Myoviews, and there is no evidence of  coronary artery disease by history or clinically.  I will see  him back in about a month after his Gastroenterology evaluation.  Hopefully, he will get a pacemaker placed.     Noralyn Pick. Eden Emms, MD, [facility]  Electronically Signed    PCN/MedQ  DD: 02/13/2007  DT: 02/13/2007  Job #: [id number]   cc:   Donalee Citrin, M.D.

## 2011-05-11 NOTE — Discharge Summary (Signed)
[name], [name]                  ACCOUNT NO.:  [id number]   MEDICAL RECORD NO.:  [id number]          PATIENT TYPE:  INP   LOCATION:  3715                         FACILITY:  [facility]   PHYSICIAN:  Doylene Canning. Ladona Ridgel, MD    DATE OF BIRTH:  04-19-1924   DATE OF ADMISSION:  04/17/2007  DATE OF DISCHARGE:  04/18/2007                               DISCHARGE SUMMARY   ALLERGIES:  1. STREPTOMYCIN.  2. PENICILLIN.   PRINCIPAL DIAGNOSES:  1. Discharging day #1 status post implantation of Medtronic Sensia      dual-chamber pacemaker.  2. Atrial fibrillation/tachy-brady syndrome.      a.     Breakout of atrial fibrillation yesterday, 4 hours 38       minutes in duration.      b.     Fatigue.   SECONDARY DIAGNOSES:  1. History of lower gastrointestinal bleed, January 2008. The patient      was discharged off Coumadin. Currently, he is on Coumadin.  2. Dyslipidemia.  3. Extracranial cerebrovascular occlusive disease, with 60+% right      internal carotid artery stenosis.  4. Status post herniorrhaphy/appendectomy/cataract surgery.  5. Nonischemic Myoview study, 2006.   PROCEDURE:  On April 17, 2007, implantation of Medtronic dual-chamber  pacemaker by Dr. Lewayne Bunting.  The patient has had no postprocedural  complications.  There is no hematoma.  Chest x-ray shows leads are in  appropriate position. The device was interrogated postprocedure day #1.  Values all within normal limits.  He did have some atrial high-rate  episodes with duration 4 hours 38 minutes at about 1700 hours on April 17, 2007.   BRIEF HISTORY:  [name] is an 77 year old male.  He has multiple  medical problems.  He has had atrial fibrillation for several years and  has been on a low dose of amiodarone.   Because of symptomatic bradycardia, the dose of amiodarone has been  gradually decreased.  It is now 100 mg daily.  However, the patient has  breakthroughs of atrial fibrillation.  These spells last for several  hours at a time.  They occur one to two times daily.   The patient has a history of a diverticular bleed for which he had to  discontinue his Coumadin. He is now referred for additional evaluation.  He was seen by physicians in [place], [place], and a pacemaker was  recommended at that time.  He is now seeking a second opinion, since he  refused immediate therapy there.  The overall plan, after extensive  discussion with [name], is implantation of the  pacemaker, uptitration of amiodarone, and reinitiation of Coumadin  therapy.  The risks, goals, benefits, expectations have been described  to the patient and family, and they are willing to proceed.   HOSPITAL COURSE:  The patient was admitted electively to [facility] on April 17, 2007. Implantation of a Medtronic Sensia dual-  chamber pacemaker was effected the same day.  The patient has had no  postprocedural complications.  The patient's INR on  admission was 2.5.  His Coumadin dose currently is 2 mg daily, except for 1 mg on Wednesday.  As I dictate this, it is expected that his amiodarone dose will be  increased, especially since he has documented evidence of a breakthrough  atrial fibrillation lasting 4-1/2 hours.  This new dose will be dictated  under separate cover.  However, the patient will continue on his  Coumadin dose.   Patient discharged on the following medications:  1. Potassium 10 mEq daily.  2. Omeprazole 20 mg 2 tablets daily.  3. Gemfibrozil 600 mg 1 tablet twice daily.  4. Amiodarone 200 mg tablets, 1/2 tablet daily currently, but this      dose may be changed prior to his discharge today.  5. Simvastatin 40 mg tablets, 1/2 tablet at bedtime daily.  6. Enteric-coated aspirin 81 mg daily.  7. Gabapentin 100 mg, 1-2 tablets at bedtime for neuralgia of both      feet.  8. Ranitidine 150 mg every other day.  9. Multivitamin daily 10.  10.Calcium plus vitamin D 600 mg daily.   11.Glucosamine chondroitin 1000 mg tablet daily.  12.Fish oil caps 1000 mg daily.  13.Oxybutynin 5 mg in the morning, 2.5 mg in the evening.  14.Ferrous sulfate 325 mg daily.  15.He has also p.r.n. medications which include mucus relief, hydroxy      for pain, Tylenol Arthritis, and Citrucel.  He will continue on all      of these.   Prior to discharge, chest x-ray has been examined and shows that the  leads are in appropriate position.  His device has been interrogated, as  mentioned above, showing 4 hours of breakthrough atrial fibrillation  with rapid rates on the afternoon of April 17, 2007.  He is currently A-  pacing prior to discharge a rate of about 66.  He is to restart his  Coumadin today.   He has followup at [facility] [address]  1. Coumadin Clinic Thursday Apr 24, 2007 at 11:45.  2. To see Dr. Eden Emms and the [facility] on May 05, 2007 at 2:45 p.m.      The patient is interested in knowing whether telephone admission      can be accomplished using a cell phone.  3. Office visit is August 12, 2007 at 9:20 to see Dr. Ladona Ridgel and have      re-interrogation of his pacemaker.   LABORATORY STUDIES PERTINENT TO THIS ADMISSION:  Hemoglobin is 12.2,  hematocrit 35, white cells 4.8, platelets 167.  Pro time is 19.9, INR is  2.5.  Sodium 140, potassium 4.1, chloride, 111, bicarbonate 26, glucose  88, BUN 15, creatinine 1.3.      Maple Mirza, PA      Doylene Canning. Ladona Ridgel, MD  Electronically Signed    GM/MEDQ  D:  04/18/2007  T:  04/18/2007  Job:  [id number]   cc:   Larose Hires C. Eden Emms, MD, [facility]  Donalee Citrin, M.D.

## 2011-05-11 NOTE — Op Note (Signed)
[name], [name]                  ACCOUNT NO.:  [id number]   MEDICAL RECORD NO.:  [id number]          PATIENT TYPE:  INP   LOCATION:  2807                         FACILITY:  [facility]   PHYSICIAN:  Doylene Canning. Ladona Ridgel, MD    DATE OF BIRTH:  [birth year]   DATE OF PROCEDURE:  04/17/2007  DATE OF DISCHARGE:                               OPERATIVE REPORT   PROCEDURE PERFORMED:  Implantation of a dual-chamber pacemaker.   INTRODUCTION:  The patient is an 77 year old male with a history of  paroxysmal atrial fibrillation and symptomatic tachy-brady syndrome, who  was admitted to the hospital for pacemaker implantation.   PROCEDURE:  After informed was obtained, the patient was taken to the  diagnostic EP lab in a fasting state.  After the usual preparation and  draping, intravenous fentanyl and midazolam were given for sedation.  Lidocaine 30 cc was infiltrated into the left infraclavicular region.  A  5 cm incision was carried out over this region and electrocautery  utilized to dissect down to the fascial plane.  Contrast 10 mL injected  in the left upper extremity venous system demonstrated a patent left  subclavian vein.  It was subsequently punctured x2 and the Medtronic  model 5076 58-cm active-fixation pacing lead, serial number [id number],  was advanced to the right ventricle.  The Medtronic model 5076 52-cm  active-fixation pacing lead, serial number [id number], advanced to the  right atrium.  Mapping was subsequently carried out in the right  ventricle and at the final site the R-waves measured 10 mV and the  pacing impedance was 760 ohms with the lead actively fixed.  Ten-volt  pacing did not stimulate the diaphragm.  The threshold 0.8 V at 0.5  msec.  With these satisfactory parameters, and attention was then turned  to placement of the atrial lead, which was placed in the anterolateral  portion of the right atrium, where P-waves measured 1.8 mV and the  pacing impedance 699 ohms and  the threshold 1.1 V at 0.5 msec and,  again, 10-volt pacing did not stimulate the diaphragm.  With these  satisfactory parameters and the lead actively fixed, the leads were  secured to the subpectoralis fascia with a figure-of-eight silk suture.  The sewing sleeve was also secured with silk suture.  Electrocautery was  then utilized to make a subcutaneous pocket.  Kanamycin was utilized to  irrigate the pocket.  The Medtronic Sentry Sensia [id number] dual-chamber  pacemaker, serial number [id number], was connected to the atrial and  ventricular pacing leads and placed back in the subcutaneous pocket.  The pocket was irrigated with kanamycin and a silk suture utilized to  secure the generator to the fascial plane.  The incision was then closed  with a layer of 2-0 Vicryl, followed by a layer of 3-0 Vicryl. followed  by a layer of 4-0 Vicryl.  Benzoin was painted on the skin and Steri-  Strips were applied and a pressure dressing was placed.  The patient was  returned to his room in satisfactory condition.   COMPLICATIONS:  There were no immediate procedure  complications.   RESULTS:  This demonstrates successful implantation of a Medtronic dual-  chamber pacemaker in a patient with symptomatic tachy-brady syndrome.      Doylene Canning. Ladona Ridgel, MD  Electronically Signed     GWT/MEDQ  D:  04/17/2007  T:  04/17/2007  Job:  [id number]   cc:   Noralyn Pick. Eden Emms, MD, [facility]  [address]  [address]  [place]  [place] [address]   Donalee Citrin, M.D.  Fax: [phone]

## 2011-05-11 NOTE — Assessment & Plan Note (Signed)
[name]                         ELECTROPHYSIOLOGY OFFICE NOTE   KELLER, BOUNDS                           MRN:          [id number]  DATE:03/03/2007                            DOB:          1924/05/05    REFERRING PHYSICIAN:  Noralyn Pick. Eden Emms, MD, [facility]   The patient is referred today by Dr. Charlton Reynolds for consideration of  permanent pacemaker insertion.   HISTORY OF PRESENT ILLNESS:  Patient is an 77 year old man who has  multiple medical problems.  He has had atrial fibrillation for several  years and has been relatively well controlled on low dose amiodarone.  Unfortunately, because of symptomatic bradycardia, his dose of  amiodarone has had to be gradually decreased so that he is now down to  100 mg daily.  The patient now has been having breakthroughs of A fib  with spells lasting for several hours at a time, 1-2 times daily.  To  complicate matters further, the patient has had a history of  diverticulosis and diverticulitis and approximately 1-1/2 months ago had  a diverticular bleed for which he had to discontinue his Coumadin.  He  is now referred for additional evaluation.  Apparently, he was seen by  his physicians up in [place], [place] and had pacemaker recommended at  that time, but he refused this and sought a second opinion here.   His social history is notable for a history of tobacco use with the  patient stopping smoking over 30 years ago.   He has a history of elevated cholesterol.   He denies alcohol abuse.  He is a retired Education officer, environmental.   PAST SURGICAL HISTORY:  Notable for a hernia, history of appendectomy,  history of eye surgery with cataract removal in the past.   Medications include warfarin, which has been held, ranitidine,  gemfibrozil, fish oil, simvastatin, amiodarone 100 mg daily, as  previously noted.   REVIEW OF SYSTEMS:  Notable for arthritic complaints, chronic reflux  symptoms, history of fatigue, and  stomach problems.   PHYSICAL EXAMINATION:  GENERAL:  Notable for him being a pleasant,  elderly-appearing man in no acute distress.  VITAL SIGNS:  Blood pressure 135/62, pulse was 53 and regular.  The  respirations were 18.  The weight was 134 pounds.  HEENT:  The patient wears glasses but otherwise normocephalic and  atraumatic.  Pupils are equal and round.  Oropharynx is moist.  Sclerae  are anicteric.  NECK:  No jugular venous distention.  There is no thyromegaly.  Trachea  is midline.  The carotids are 2+ and symmetric.  LUNGS:  Clear bilaterally to auscultation.  There were no wheezes,  rales, or rhonchi.  There was no increased work of breathing.  CARDIOVASCULAR:  Regular bradycardia with a normal S1 and S2.  There  were no murmurs, rubs or gallops appreciated.  The PMI was not enlarged,  Willie was it laterally displaced.  ABDOMEN:  Soft, nontender, nondistended.  There is no organomegaly.  The  bowel sounds are present.  There is no guarding or rebound.  EXTREMITIES:  No clubbing,  cyanosis or edema.  The pulses were 2+ and  symmetric.   The EKG demonstrates a sinus bradycardia with normal axis and intervals.   IMPRESSION:  1. Symptomatic tachybrady syndrome with paroxysmal atrial      fibrillation.  2. Amiodarone (low dose) secondary to #1.  3. History of diverticular bleed resulting in the Coumadin being held.   DISCUSSION:  Today, there was extensive discussion with [name], his  wife, and his son regarding the pro's and con's of proceeding with  pacemaker implantation.  There were many questions.  I recommended  proceeding with pacemaker implantation and up-titration of the  amiodarone and reinitiation of the Coumadin therapy.  The risks,  benefits, goals, and expectations have been discussed with the patient  and his family in careful detail.  They would like to proceed with this.     Doylene Canning. Ladona Ridgel, MD  Electronically Signed    [name]/[name]  DD: 03/03/2007  DT:  03/03/2007  Job #: [id number]   cc:   Willie Ora, MD

## 2011-05-11 NOTE — Discharge Summary (Signed)
NAMEGLADSTONE, ROSAS                  ACCOUNT NO.:  [id number]   MEDICAL RECORD NO.:  [id number]          PATIENT TYPE:  INP   LOCATION:  3715                         FACILITY:  [facility]   PHYSICIAN:  Doylene Canning. Ladona Ridgel, MD    DATE OF BIRTH:  1924-07-31   DATE OF ADMISSION:  04/17/2007  DATE OF DISCHARGE:  04/18/2007                               DISCHARGE SUMMARY   ADDENDUM:    This has to do with medication adjustments made to just prior to the  patient's discharge April 18, 2007.  1. Amiodarone has been increased from 100 mg daily to 200 mg daily.      This is because the patient is having recurrent breakthrough of his      atrial fibrillation on the lower dose and now has a pacemaker.  2. Coumadin has been adjusted slightly to account for possible      increase in INR on his home dose.  He will present to the Coumadin      Clinic Apr 24, 2007 at 11:45. His doses going out of here are Friday      2 mg, Saturday 2 mg, Sunday 2 mg, Monday 1 mg, Tuesday 2 mg,      Wednesday 2 mg. It has not substantially changed primarily due to      the fact that his Coumadin was not given on April 17, 2007, and      this was by mistake. So, he will present to the Coumadin clinic Apr 24, 2007 at 11:45. He may need his Coumadin dose decreased at that      time.      Maple Mirza, PA      Doylene Canning. Ladona Ridgel, MD  Electronically Signed    GM/MEDQ  D:  04/18/2007  T:  04/18/2007  Job:  [id number]   cc:   Noralyn Pick. Eden Emms, MD, [facility]

## 2011-05-11 NOTE — Assessment & Plan Note (Signed)
[name]                         GASTROENTEROLOGY OFFICE NOTE   [name], [name]                           MRN:          [id number]  DATE:03/12/2007                            DOB:          Sep 27, 1924    PRIMARY GASTROENTEROLOGIST:  Dr. Bartolo Darter in [place], [place].   GI PROBLEM LIST:  1. Lower GI bleed, likely diverticular in origin, 12/2006. Presented      with several episodes of painless hematochezia. INR was in the 3s.      Hemoglobin drifted to 8.3 and he was transfused 2 units of red      cells and his Coumadin was held. He was hemodynamically stable      throughout and ended up spending 3 nights in the hospital and was      discharged off of Coumadin.  2. Colonoscopy in 2006 or 2007 by Dr. Bartolo Darter was normal except for      diverticular disease (per patient).   INTERVAL HISTORY:  I last saw [name] at the time of his  hospitalization in [facility] for painless hematochezia.  Clinically, this was consistent with a diverticular bleed. He was on  Coumadin for atrial fibrillation, which likely contributed to the amount  of bleeding. Since discharge, he has been off of Coumadin until just  earlier this week when his primary cardiologist, Dr. Ladona Ridgel, restarted  it. He has had no recurrent GI bleeding. He has normal bowel movements  once daily.   CURRENT MEDICATIONS:  1. Potassium.  2. Omeprazole.  3. Gemfibrozil.  4. Amiodarone.  5. Simvastatin.  6. Aspirin.  7. Gabapentin.  8. Ranitidine.  9. Multivitamin.  10.Calcium.  11.Oxybutynin.  12.Iron once daily.  13.Coumadin, restarted earlier this week.   PHYSICAL EXAMINATION:  VITAL SIGNS:  Weight 132 pounds, blood pressure  128/62, pulse 72.  CONSTITUTIONAL:  Generally well appearing, somewhat frail older man.  LUNGS:  Clear to auscultation bilaterally.  HEART:  Irregularly irregular.  ABDOMEN:  Soft and nontender, nondistended, normal bowel sounds.   ASSESSMENT AND PLAN:   77 year old man with resolved lower GI bleeding  likely diverticular.   I believe that I did look at the colonoscopy report from his [place], [place]  colonoscopy while he was hospitalized at [facility], but I do not have  that here for permanent records at [facility], so we will send away for  those for re-review. He has known diverticulosis and had a bleed that  was consistent with diverticular hemorrhage. I think that this is the  first lower GI bleeding that he has ever had and I think that it is  reasonable to restart him on Coumadin as has already been done and  simply see how he does. If he bleeds again while on Coumadin then we  will have to consider trying to localize the lower GI bleeding which is  best done when he is actively bleeding. If there is anything in the  colonoscopy report from Dr. Bartolo Darter that is concerning, I may have to  proceed with colonoscopy sooner than then (such as a large recent polyp  removal). Otherwise, I think simply observing him clinically is  reasonable. He knows to get in touch if he has any further bleeding. He  was restarted on Coumadin and we will get coags on him and fax that back  to his Coumadin clinic.     Rachael Fee, MD  Electronically Signed    DPJ/MedQ  DD: 03/12/2007  DT: 03/13/2007  Job #: [id number]   cc:   Doylene Canning. Ladona Ridgel, MD  Noralyn Pick Eden Emms, MD, [facility]  Basilia Jumbo, MD

## 2011-05-11 NOTE — H&P (Signed)
[name], [name]                  ACCOUNT NO.:  [id number]   MEDICAL RECORD NO.:  [id number]          PATIENT TYPE:  INP   LOCATION:  0103                         FACILITY[facility]   PHYSICIAN:  [name], M.D.    DATE OF BIRTH:  05-22-1924   DATE OF ADMISSION:  01/21/2007  DATE OF DISCHARGE:                              HISTORY & PHYSICAL   PRIMARY CARE PHYSICIAN:  Dr. Joycelyn Reynolds, [place], [place].   CHIEF COMPLAINT:  Rectal bleeding.   HISTORY OF PRESENT ILLNESS:  The patient is an 77 year old Reynolds with a  past medical history significant for colon polyps, atrial fibrillation  on chronic Coumadin therapy, and anemia who presents to the emergency  department with the chief complaint of rectal bleeding.  At  approximately 5:00 p.m. last night, the patient felt as if he was going  to pass gas.  However, he had an episode of bloody stool which he was  incontinent of.  The blood saturated his pants.  Over the course of the  next 4 to 5 hours, the patient had a total of 6 large bloody stools, 3  actually occurred between the hours of 5:00 p.m. and 7:00 p.m.  The  blood was initially mixed with a little stool; however, the last 2 or 3  episodes were primarily blood and blood clots.  The color of the blood  has been red and intermittently maroon.  No recent history of black,  tarry stools.  The patient has no complaints of abdominal pain, nausea,  or vomiting.  He became a little lightheaded in the emergency  department.  He has had some mild shortness of breath which he has  chronically.  He has not had any chest pain.  No recent fever or chills.  No complaints of painful urination.   The patient has no prior history of rectal bleeding although he says  that his last colonoscopy showed that he had colon polyps.  He reports  no history of hemorrhoids, diverticulosis, or tumors.  He takes Coumadin  and aspirin as prescribed.  No recent use of other NSAIDs.  He denies  alcohol  use.   During the evaluation in the emergency department, the patient is noted  to be hemodynamically stable.  The admitting physician witnessed two  small to moderate bloody stools during the examination.  His lab data  are significant for an INR of 3.1 and a hemoglobin of 10.5.  The patient  will, therefore, be admitted for further evaluation and management.   PAST MEDICAL HISTORY:  1. Chronic atrial fibrillation, on Coumadin therapy.  2. Colon polyps per colonoscopy in 2007  ([place]).  3. Hypertension.  4. Chronic anemia, on iron therapy.  The patient does not recall his      baseline hemoglobin.  5. Hyperlipidemia.  6. Gastroesophageal reflux disease.  7. History of kidney stones status post surgical removal of the stones      on the right.  8. Status post inguinal hernia repair in the past.  9. Status post appendectomy in the past.  10.Penicillin and streptomycin allergies  causing syncope and clotted      blood.   MEDICATIONS:  1. Aspirin 81 mg daily.  2. Coumadin 2 mg daily except on Mondays and Thursdays 1-1/2 pills.  3. Ferrous sulfate 325 mg daily.  4. Gemfibrozil 600 mg b.i.d.  5. Multivitamin with iron once daily.  6. Omeprazole 20 mg daily.  7. Oxybutynin chloride 5 mg in the morning and a 1/2 a pill in the      evening.  8. Potassium chloride 10 mEq daily.  9. Zantac 150 mg 3 to 4 days a week.  10.Simvastatin 40 mg nightly.  11.Gabapentin 100 mg nightly p.r.n.  12.Amiodarone 200 mg 1/2 a tablet daily.   ALLERGIES:  THE PATIENT HAS ALLERGIES TO PENICILLIN AND STREPTOMYCIN.   SOCIAL HISTORY:  The patient lives in [place], [place].  He is  currently visiting his [place].  The patient  is married and has two children.  He is retired.  He stopped smoking in  1972.  He denies alcohol use and illicit drug use.   FAMILY HISTORY:  His father died of kidney failure and may have had a  history of cancer.  His mother died of heart failure at  17 years of age.   REVIEW OF SYSTEMS:  Positive for occasional arthritis pain, occasional  reflux symptoms, occasional shortness of breath, occasional chest  palpitations.  Otherwise, review of systems is negative.   PHYSICAL EXAMINATION:  VITAL SIGNS:  Temperature 98.4, blood pressure  164/73, pulse 60, respiratory rate 18, oxygen saturation 98% on room  air.  GENERAL:  The patient is a pleasant elderly 77 year old Caucasian Reynolds  who is currently lying in bed in no acute distress.  HEENT:  Head is normocephalic and non traumatic.  Pupils are equal,  round, and reactive to light.  Extraocular movements are intact.  Conjunctivae are clear.  Sclerae are white.  Nasal mucosa is dry.  No  sinus tenderness.  Oropharynx reveals fair dentition.  Mucous membranes  are mildly dry.  No posterior exudates or erythema.  NECK:  Supple.  No lymphadenopathy.  No thyromegaly.  No bruit.  No JVD.  LUNGS:  Clear to auscultation bilaterally.  HEART:  S1 and S2 with a soft systolic murmur.  (The patient appears to  be in normal sinus rhythm although he has chronic atrial fibrillation).  ABDOMEN:  Positive bowel sounds.  Soft, nontender, and nondistended.  No  hepatosplenomegaly.  No masses palpated.  RECTAL:  Per the emergency department PA the exam revealed red blood in  the rectal vault,  guaiac positive.  GENITOURINARY:  Deferred.  EXTREMITIES:  Pedal pulses barely palpable bilaterally.  No pretibial  edema and no pedal edema.  NEUROLOGIC:  The patient is alert and oriented x3.  Cranial nerves II-  XII are intact.  Sensation is intact throughout.  Strength is 5/5  throughout.  Gait is intact.   ADMISSION LABORATORIES:  EKG reveals normal sinus rhythm with a heart  rate of 60 beats per minute and no acute abnormalities.  PT 33.7, INR  3.1, WBCs 6.5, hemoglobin 10.5, hematocrit 29.9, MCV 100.4, platelets 179.  Sodium 133, potassium 3.6, chloride 109, CO2 of 22, glucose 127,  BUN 28, creatinine 1.36,  calcium 8.2.   ASSESSMENT:  1. Rectal bleeding/lower gastrointestinal bleed.  The patient has      known colon polyps by history.  The etiology of his rectal bleeding      is unclear at this time, however,  will consider colon polyps, AVMs,      diverticulosis, hemorrhoids, or colon mass.  2. Macrocytic anemia in the setting of anticoagulation.  The patient's      hemoglobin is 10.5, and his MCV is 100.4.  His anemia is secondary      to acute blood loss in part.  However, given the macrocytosis,      vitamin B12 deficiency, and folate deficiency, will need to be      ruled out.  The patient also may have an element of chronic iron-      deficiency anemia as he is being treated with ferrous sulfate.  3. Coagulopathy secondary to Coumadin.  The patient's INR is 3.1.  4. Chronic atrial fibrillation.  The patient is currently in normal      sinus rhythm.  He is chronically treated with amiodarone and      Coumadin.  His heart rate is well within normal limits.  He is      hemodynamically stable.  5. Elevated blood pressure.  The patient gives a remote history of      hypertension.  He is not being treated with any antihypertensive      medications.  6. Azotemia/renal insufficiency.  The patient's BUN is 28, and his      creatinine is 1.5.  More than likely, the azotemia is secondary to      GI blood loss.   PLAN:  1. The patient will be admitted to a step-down bed.  2. Will start IV fluids with normal saline at 70 mL an hour.  3. Will give 2 units of fresh frozen plasma now followed by vitamin K      5 mg x1.  4. Will type and cross 3 units of packed red blood cells and hold for      now.  5. Will check the patient's CBC every 4 hours x24 hours and then every      12 hours thereafter.  6. Following the fresh frozen plasma, we will reassess his INR/PT.      Will hold Coumadin and aspirin for now.  7. Will check iron studies.  8. Gastroenterologist, Dr. Arlyce Dice, has already been  consulted and will      see the patient later in the morning.      [name], M.D.  Electronically Signed     DF/MEDQ  D:  01/22/2007  T:  01/22/2007  Job:  [id number]

## 2011-05-11 NOTE — Discharge Summary (Signed)
NAMEPHELIX, [name]                  ACCOUNT NO.:  [id number]   MEDICAL RECORD NO.:  [id number]          PATIENT TYPE:  INP   LOCATION:  1603                         FACILITY[facility]  [facility]   PHYSICIAN:  Hind I Elsaid, MD      DATE OF BIRTH:  Oct 24, 1924   DATE OF ADMISSION:  01/21/2007  DATE OF DISCHARGE:  01/25/2007                               DISCHARGE SUMMARY   PRIMARY CARE PHYSICIAN:  Dr. Joycelyn Man, [place], [place].   DISCHARGE DIAGNOSES:  1. Gastrointestinal bleeding most probably due to extensive      diverticula which was stopped at this time.  2. History of chronic atrial fibrillation.  3. Colonic polyp per colonoscopy in 2007.  4. Hypertension.  5. Chronic anemia on iron supplement.  6. Hyperlipidemia.  7. Gastroesophageal reflux disease.  8. Status post removal of stone in the kidney.  9. Status post inguinal hernia repair in the past.  10.Status post appendectomy in the past.   DISCHARGE MEDICATIONS:  1. Ferrous sulfate 325 mg p.o. daily.  2. Gemfibrozil 600 mg p.o. b.i.d.  3. Multivitamin with iron once daily.  4. Protonix 40 mg p.o. daily.  5. Oxybutynin 5 mg in the morning and 1/2 pill in the evening.  6. Zocor 20 mg p.o. q.h.s.  7. Neurontin 100 mg q.h.s. p.r.n.  8. Amiodarone 200 mg 1/2 tab daily.  9. We will hold aspirin and Coumadin for at least 2 weeks at this      time.   CONSULTATION:  GI was consulted from [facility] for GI bleeding.   PROCEDURES:  None.   HISTORY OF PRESENT ILLNESS:  An 77 year old male with a history of  chronic atrial fibrillation on amiodarone now in normal sinus rhythm on  chronic Coumadin therapy and aspirin therapy, presented with painless  lower GI bleeding with drop in hemoglobin from day of admission from  10.5 to 8.3 next day.  The patient was placed in the intensive care unit  for close observation.  Two units of blood transfusion was ordered for  the patient and received.  Admission vital signs continued to be stable.  GI  was consulted where they recommended previous colonoscopy report from  [place].  Lower GI bleeding was thought to be from the diverticular  disease involved with AV malformation which was stopped at that time.  A  CT scan which was done in 2006 showed diverticular disease involving the  left transverse colon, descending colon, and sigmoid colon, but there  was no evidence of inflammatory change or active inflammatory change  found.  Bleeding was stopped.  Coumadin and aspirin were stopped for at  least 2 weeks.  The patient received 2 units of blood transfusion and  started on ferrous sulfate and multivitamin with iron.  GI recommended  patient, as long as there is no evidence of any further bleeding, the  patient to be observed for 24 hours.  Hemoglobin remained stable during  observation.  No further intervention to be done.  The patient was  advised to return immediately to the hospital if the bleeding continued.  At this time, the patient has no evidence of active bleeding.  Last  bowel movement today is black but bed the patient on ferrous sulfate.  The patient and the son deny any bleeding for 2 days at this time.  We  will continue with supportive measures and advance the diet as the  patient tolerated.  The patient tolerated a diet very well with no  abdominal pain and no evidence of acute blood loss.  The patient will be  discharged home today.  The patient was fully oriented.  If any sign of  any further bleeding happened, he should return back immediately to the  hospital.  He should stop taking Coumadin and aspirin for at least 2  weeks, and to be reviewed further by the patient's cardiologist if he  really needs Coumadin or not, and the patient should take his regular  diet.  On discharge, the patient's vital signs:  Temperature 98, pulse  rate 49-58, respiratory rate 20, blood pressure 123/55.  White blood  cells 6.3, hemoglobin 10.3, hematocrit 29.8, and platelet count 148.   We  think the patient is medically stable for discharge and to follow with  his primary care physician and to follow up with the cardiologist for  further recommendation regarding Coumadin and aspirin.  The patient was  advised to return immediately to the hospital if he has another episode  of rectal bleeding.      Hind Bosie Helper, MD  Electronically Signed     HIE/MEDQ  D:  01/25/2007  T:  01/25/2007  Job:  [id number]

## 2011-08-21 ENCOUNTER — Ambulatory Visit (HOSPITAL_BASED_OUTPATIENT_CLINIC_OR_DEPARTMENT_OTHER)
Admission: RE | Admit: 2011-08-21 | Discharge: 2011-08-21 | Disposition: A | Discharge status: Home / Self Care | Payer: Medicare Other | Source: Ambulatory Visit | Attending: Urology | Admitting: Urology

## 2011-08-21 ENCOUNTER — Ambulatory Visit (HOSPITAL_COMMUNITY)
Admission: RE | Admit: 2011-08-21 | Discharge: 2011-08-21 | Disposition: A | Discharge status: Home / Self Care | Payer: Medicare Other | Source: Ambulatory Visit | Attending: Urology | Admitting: Urology

## 2011-08-21 DIAGNOSIS — Z950 Presence of cardiac pacemaker: Secondary | ICD-10-CM | POA: Insufficient documentation

## 2011-08-21 DIAGNOSIS — I10 Essential (primary) hypertension: Secondary | ICD-10-CM | POA: Insufficient documentation

## 2011-08-21 DIAGNOSIS — N210 Calculus in bladder: Secondary | ICD-10-CM | POA: Insufficient documentation

## 2011-08-21 DIAGNOSIS — I739 Peripheral vascular disease, unspecified: Secondary | ICD-10-CM | POA: Insufficient documentation

## 2011-08-21 DIAGNOSIS — M949 Disorder of cartilage, unspecified: Secondary | ICD-10-CM | POA: Insufficient documentation

## 2011-08-21 DIAGNOSIS — I714 Abdominal aortic aneurysm, without rupture, unspecified: Secondary | ICD-10-CM | POA: Insufficient documentation

## 2011-08-21 DIAGNOSIS — M8448XA Pathological fracture, other site, initial encounter for fracture: Secondary | ICD-10-CM | POA: Insufficient documentation

## 2011-08-21 DIAGNOSIS — M899 Disorder of bone, unspecified: Secondary | ICD-10-CM | POA: Insufficient documentation

## 2011-08-21 DIAGNOSIS — K219 Gastro-esophageal reflux disease without esophagitis: Secondary | ICD-10-CM | POA: Insufficient documentation

## 2011-08-21 DIAGNOSIS — Z01810 Encounter for preprocedural cardiovascular examination: Secondary | ICD-10-CM | POA: Insufficient documentation

## 2011-08-21 DIAGNOSIS — G8929 Other chronic pain: Secondary | ICD-10-CM | POA: Insufficient documentation

## 2011-08-21 DIAGNOSIS — Z01818 Encounter for other preprocedural examination: Secondary | ICD-10-CM | POA: Insufficient documentation

## 2011-08-21 LAB — POCT I-STAT 4, (NA,K, GLUC, HGB,HCT)
Glucose, Bld: 87 mg/dL (ref 70–99)
Potassium: 4.6 mEq/L (ref 3.5–5.1)
Sodium: 142 mEq/L (ref 135–145)

## 2011-08-29 NOTE — Op Note (Addendum)
  NAMELENNIN, OSMOND                  ACCOUNT NO.:  [id number]  MEDICAL RECORD NO.:  [id number]  LOCATION:  XRAY                         FACILITY:  [facility]  PHYSICIAN:  Martina Sinner, MD DATE OF BIRTH:  10-20-1924  DATE OF PROCEDURE:  08/21/2011 DATE OF DISCHARGE:  08/21/2011                              OPERATIVE REPORT   PREOPERATIVE DIAGNOSIS:  Bladder stone.  POSTOPERATIVE DIAGNOSIS:  Bladder stone.  SURGERY:  Cystoscopy, transurethral removal of bladder stone.  SURGEON:  Bekki Tavenner A. Atanacio Melnyk, MD  [name] has irritative voiding symptoms.  He has recurrent urinary tract infections.  He maybe chronically colonized.  In the office, he had a 1 cm stone in his bladder.  The patient was prepped and draped in usual fashion.  Preoperative antibiotics were given.  A 21 scope was utilized.  Penile, bulbar, membranous urethra normal.  Verumontanum was present.  He had short scarred prostatic urethra.  He had grade 2/4 bladder trabeculation. There was no stricture or carcinoma.  He had a 1 cm stone on the floor of his bladder.  The stone was easily removed with grasping forceps.  It came out easily. I re-inspected the bladder in total and there was not a second stone. There was no bleeding.  Catheter was not inserted.  The patient will be followed as per protocol.          ______________________________ Martina Sinner, MD     [name]/MEDQ  D:  08/21/2011  T:  08/22/2011  Job:  [id number]  Electronically Signed by Alfredo Martinez MD on 08/29/2011 08:15:58 AM

## 2011-09-18 LAB — DIFFERENTIAL
Basophils Absolute: 0.1
Basophils Relative: 0
Lymphocytes Relative: 13
Lymphocytes Relative: 4 — ABNORMAL LOW
Lymphs Abs: 0.7
Lymphs Abs: 1
Lymphs Abs: 1.1
Monocytes Absolute: 0.6
Monocytes Relative: 7
Monocytes Relative: 9
Neutro Abs: 12.3 — ABNORMAL HIGH
Neutro Abs: 17.5 — ABNORMAL HIGH
Neutro Abs: 5.7
Neutrophils Relative %: 85 — ABNORMAL HIGH
Neutrophils Relative %: 90 — ABNORMAL HIGH

## 2011-09-18 LAB — COMPREHENSIVE METABOLIC PANEL
AST: 20
Albumin: 2.6 — ABNORMAL LOW
Alkaline Phosphatase: 66
BUN: 8
Chloride: 98
GFR calc Af Amer: >60
Potassium: 3.9
Sodium: 128 — ABNORMAL LOW
Total Bilirubin: 0.6
Total Protein: 5.7 — ABNORMAL LOW

## 2011-09-18 LAB — CBC
HCT: 29.5 — ABNORMAL LOW
HCT: 31.4 — ABNORMAL LOW
HCT: 32.8 — ABNORMAL LOW
HCT: 33.4 — ABNORMAL LOW
Hemoglobin: 10.2 — ABNORMAL LOW
Hemoglobin: 10.6 — ABNORMAL LOW
Hemoglobin: 10.8 — ABNORMAL LOW
Hemoglobin: 11.2 — ABNORMAL LOW
Hemoglobin: 11.9 — ABNORMAL LOW
Hemoglobin: 11.9 — ABNORMAL LOW
Hemoglobin: 12.7 — ABNORMAL LOW
Hemoglobin: 13.3
MCHC: 33.7
MCHC: 34.1
MCHC: 34.1
MCHC: 34.6
MCHC: 34.7
MCHC: 34.9
MCHC: 35.5
MCV: 100.3 — ABNORMAL HIGH
MCV: 100.6 — ABNORMAL HIGH
MCV: 101.2 — ABNORMAL HIGH
MCV: 101.2 — ABNORMAL HIGH
Platelets: 233
Platelets: 251
Platelets: 253
Platelets: 294
RBC: 2.93 — ABNORMAL LOW
RBC: 3.1 — ABNORMAL LOW
RBC: 3.24 — ABNORMAL LOW
RBC: 3.3 — ABNORMAL LOW
RBC: 3.33 — ABNORMAL LOW
RBC: 3.67 — ABNORMAL LOW
RBC: 3.77 — ABNORMAL LOW
RDW: 13.5
RDW: 13.5
RDW: 13.5
RDW: 13.6
RDW: 13.8
WBC: 10.8 — ABNORMAL HIGH
WBC: 11.7 — ABNORMAL HIGH
WBC: 14.5 — ABNORMAL HIGH
WBC: 18.2 — ABNORMAL HIGH
WBC: 8.4

## 2011-09-18 LAB — URINALYSIS, ROUTINE W REFLEX MICROSCOPIC
Bilirubin Urine: NEGATIVE
Glucose, UA: 100 — AB
Ketones, ur: NEGATIVE
Nitrite: NEGATIVE
Protein, ur: 100 — AB
Specific Gravity, Urine: 1.013
pH: 6.5

## 2011-09-18 LAB — BASIC METABOLIC PANEL
BUN: 6
BUN: 8
BUN: 9
CO2: 22
CO2: 23
CO2: 24
CO2: 24
CO2: 24
Calcium: 8.3 — ABNORMAL LOW
Calcium: 8.5
Calcium: 8.6
Calcium: 8.6
Calcium: 8.7
Calcium: 9
Chloride: 104
Chloride: 99
Creatinine, Ser: 0.86
Creatinine, Ser: 0.94
GFR calc Af Amer: >60
GFR calc Af Amer: >60
GFR calc Af Amer: >60
GFR calc Af Amer: >60
GFR calc Af Amer: >60
GFR calc Af Amer: >60
GFR calc non Af Amer: >60
GFR calc non Af Amer: >60
GFR calc non Af Amer: >60
GFR calc non Af Amer: >60
GFR calc non Af Amer: >60
Glucose, Bld: 113 — ABNORMAL HIGH
Glucose, Bld: 81
Glucose, Bld: 90
Glucose, Bld: 90
Glucose, Bld: 93
Potassium: 3.4 — ABNORMAL LOW
Potassium: 3.6
Potassium: 3.7
Potassium: 3.8
Potassium: 4.1
Sodium: 129 — ABNORMAL LOW
Sodium: 133 — ABNORMAL LOW
Sodium: 133 — ABNORMAL LOW
Sodium: 134 — ABNORMAL LOW
Sodium: 134 — ABNORMAL LOW
Sodium: 135

## 2011-09-18 LAB — PROTIME-INR
INR: 4.8 — ABNORMAL HIGH
Prothrombin Time: 15

## 2011-09-18 LAB — HEPATIC FUNCTION PANEL
ALT: 21
AST: 24
Alkaline Phosphatase: 70
Indirect Bilirubin: 0.3
Total Protein: 5.9 — ABNORMAL LOW

## 2011-09-18 LAB — CULTURE, BLOOD (ROUTINE X 2)

## 2011-09-18 LAB — URINE CULTURE
Colony Count: 15000
Colony Count: >=100000

## 2011-09-18 LAB — TSH: TSH: 0.53

## 2011-09-18 LAB — URINE MICROSCOPIC-ADD ON

## 2011-09-18 LAB — RPR: RPR Ser Ql: NONREACTIVE

## 2011-09-18 LAB — T4, FREE: Free T4: 1.29

## 2011-09-18 LAB — APTT: aPTT: 61 — ABNORMAL HIGH

## 2011-09-18 LAB — GIARDIA/CRYPTOSPORIDIUM SCREEN(EIA): Giardia Screen - EIA: NEGATIVE

## 2011-09-18 LAB — AMYLASE: Amylase: 63

## 2011-09-20 LAB — CBC
HCT: 35.5 — ABNORMAL LOW
Hemoglobin: 12 — ABNORMAL LOW
MCV: 101.2 — ABNORMAL HIGH
WBC: 7.8

## 2011-09-20 LAB — BASIC METABOLIC PANEL
Chloride: 101
GFR calc non Af Amer: 54 — ABNORMAL LOW
Potassium: 4
Sodium: 132 — ABNORMAL LOW

## 2011-09-24 NOTE — Op Note (Addendum)
  NAMECEASAR, DECANDIA                  ACCOUNT NO.:  [id number]  MEDICAL RECORD NO.:  [id number]  LOCATION:  XRAY                         FACILITY:  [facility]  PHYSICIAN:  Martina Sinner, MD DATE OF BIRTH:  05-May-1924  DATE OF PROCEDURE:  08/23/2011 DATE OF DISCHARGE:  08/21/2011                              OPERATIVE REPORT   PREOPERATIVE DIAGNOSIS:  Small bladder stone.  POSTOPERATIVE DIAGNOSIS:  Small bladder stone.  SURGERY:  Transurethral removal of bladder stone.  SURGEON:  Kaeleen Odom A. Shantina Chronister, MD  Mr. Lamantia has lower urinary tract symptoms and a small bladder stone. It was felt to be too large to remove in the clinic.  The patient was prepped and draped in usual fashion.  He was given preoperative antibiotics.  A 22-French scope with 30-degree lens was utilized. Penile, bulbar, and membranous urethra were normal.  He had mild bilobar enlargement of the prostate.  A grade 2 to 4 bladder trabeculation. Approximately a 1 cm stone was seen sitting nicely on floor of the bladder.  It was easily grasped with grasping forceps and I was just able to bring it out through the urethra.  There was no bleeding. Bladder was emptied and the patient was taken to recovery room.          ______________________________ Martina Sinner, MD     [place]/MEDQ  D:  09/18/2011  T:  09/18/2011  Job:  [id number]  Electronically Signed by Alfredo Martinez MD on 09/24/2011 06:18:36 PM

## 2011-09-28 ENCOUNTER — Other Ambulatory Visit: Payer: Self-pay | Admitting: Neurosurgery

## 2011-09-28 DIAGNOSIS — M549 Dorsalgia, unspecified: Secondary | ICD-10-CM

## 2011-10-01 ENCOUNTER — Other Ambulatory Visit (HOSPITAL_COMMUNITY): Payer: Self-pay | Admitting: Neurosurgery

## 2011-10-01 DIAGNOSIS — IMO0002 Reserved for concepts with insufficient information to code with codable children: Secondary | ICD-10-CM

## 2011-10-02 ENCOUNTER — Ambulatory Visit
Admission: RE | Admit: 2011-10-02 | Discharge: 2011-10-02 | Disposition: A | Discharge status: Home / Self Care | Payer: Medicare Other | Source: Ambulatory Visit | Attending: Neurosurgery | Admitting: Neurosurgery

## 2011-10-02 DIAGNOSIS — M549 Dorsalgia, unspecified: Secondary | ICD-10-CM

## 2011-10-03 ENCOUNTER — Other Ambulatory Visit (HOSPITAL_COMMUNITY): Payer: Self-pay | Admitting: Interventional Radiology

## 2011-10-03 DIAGNOSIS — IMO0002 Reserved for concepts with insufficient information to code with codable children: Secondary | ICD-10-CM

## 2011-10-05 ENCOUNTER — Other Ambulatory Visit (HOSPITAL_COMMUNITY): Payer: Self-pay | Admitting: Interventional Radiology

## 2011-10-05 ENCOUNTER — Ambulatory Visit (HOSPITAL_COMMUNITY)
Admission: RE | Admit: 2011-10-05 | Discharge: 2011-10-05 | Disposition: A | Discharge status: Home / Self Care | Payer: Medicare Other | Source: Ambulatory Visit | Attending: Neurosurgery | Admitting: Neurosurgery

## 2011-10-05 DIAGNOSIS — IMO0002 Reserved for concepts with insufficient information to code with codable children: Secondary | ICD-10-CM

## 2011-10-08 ENCOUNTER — Ambulatory Visit (HOSPITAL_COMMUNITY): Payer: Medicare Other

## 2011-10-08 ENCOUNTER — Encounter (HOSPITAL_COMMUNITY)
Admission: RE | Admit: 2011-10-08 | Discharge: 2011-10-08 | Disposition: A | Discharge status: Home / Self Care | Payer: Medicare Other | Source: Ambulatory Visit | Attending: Interventional Radiology | Admitting: Interventional Radiology

## 2011-10-08 DIAGNOSIS — S22009A Unspecified fracture of unspecified thoracic vertebra, initial encounter for closed fracture: Secondary | ICD-10-CM | POA: Insufficient documentation

## 2011-10-08 DIAGNOSIS — Z9889 Other specified postprocedural states: Secondary | ICD-10-CM | POA: Insufficient documentation

## 2011-10-08 DIAGNOSIS — X58XXXA Exposure to other specified factors, initial encounter: Secondary | ICD-10-CM | POA: Insufficient documentation

## 2011-10-08 DIAGNOSIS — IMO0002 Reserved for concepts with insufficient information to code with codable children: Secondary | ICD-10-CM

## 2011-10-08 MED ORDER — TECHNETIUM TC 99M MEDRONATE IV KIT
25.0000 | PACK | Freq: Once | INTRAVENOUS | Status: AC | PRN
  Administered 2011-10-08: 25 via INTRAVENOUS

## 2011-10-10 ENCOUNTER — Other Ambulatory Visit (HOSPITAL_COMMUNITY): Payer: Self-pay | Admitting: Interventional Radiology

## 2011-10-10 ENCOUNTER — Ambulatory Visit (HOSPITAL_COMMUNITY)
Admission: RE | Admit: 2011-10-10 | Discharge: 2011-10-10 | Disposition: A | Discharge status: Home / Self Care | Payer: Medicare Other | Source: Ambulatory Visit | Attending: Interventional Radiology | Admitting: Interventional Radiology

## 2011-10-10 DIAGNOSIS — G7000 Myasthenia gravis without (acute) exacerbation: Secondary | ICD-10-CM | POA: Insufficient documentation

## 2011-10-10 DIAGNOSIS — IMO0002 Reserved for concepts with insufficient information to code with codable children: Secondary | ICD-10-CM

## 2011-10-10 DIAGNOSIS — I714 Abdominal aortic aneurysm, without rupture, unspecified: Secondary | ICD-10-CM | POA: Insufficient documentation

## 2011-10-10 DIAGNOSIS — M4800 Spinal stenosis, site unspecified: Secondary | ICD-10-CM | POA: Insufficient documentation

## 2011-10-10 DIAGNOSIS — Z01812 Encounter for preprocedural laboratory examination: Secondary | ICD-10-CM | POA: Insufficient documentation

## 2011-10-10 DIAGNOSIS — N183 Chronic kidney disease, stage 3 unspecified: Secondary | ICD-10-CM | POA: Insufficient documentation

## 2011-10-10 DIAGNOSIS — M8448XA Pathological fracture, other site, initial encounter for fracture: Secondary | ICD-10-CM | POA: Insufficient documentation

## 2011-10-10 DIAGNOSIS — I4891 Unspecified atrial fibrillation: Secondary | ICD-10-CM | POA: Insufficient documentation

## 2011-10-10 LAB — BASIC METABOLIC PANEL
Calcium: 9.6 mg/dL (ref 8.4–10.5)
Creatinine, Ser: 1.34 mg/dL (ref 0.50–1.35)
GFR calc non Af Amer: 47 mL/min — ABNORMAL LOW (ref >90)
Sodium: 136 mEq/L (ref 135–145)

## 2011-10-10 LAB — APTT: aPTT: 33 seconds (ref 24–37)

## 2011-10-10 LAB — CBC
HCT: 39.8 % (ref 39.0–52.0)
Hemoglobin: 13.6 g/dL (ref 13.0–17.0)
MCV: 102.3 fL — ABNORMAL HIGH (ref 78.0–100.0)
RDW: 13.1 % (ref 11.5–15.5)
WBC: 10.1 10*3/uL (ref 4.0–10.5)

## 2011-10-10 LAB — PROTIME-INR: Prothrombin Time: 13 seconds (ref 11.6–15.2)

## 2011-10-10 MED ORDER — IOHEXOL 300 MG/ML  SOLN
50.0000 mL | Freq: Once | INTRAMUSCULAR | Status: AC | PRN
  Administered 2011-10-10: 3 mL

## 2011-10-12 ENCOUNTER — Other Ambulatory Visit: Payer: Self-pay

## 2011-10-15 ENCOUNTER — Other Ambulatory Visit: Payer: Self-pay | Admitting: Internal Medicine

## 2011-10-15 ENCOUNTER — Other Ambulatory Visit (HOSPITAL_COMMUNITY): Payer: Self-pay | Admitting: Interventional Radiology

## 2011-10-15 DIAGNOSIS — Z09 Encounter for follow-up examination after completed treatment for conditions other than malignant neoplasm: Secondary | ICD-10-CM

## 2011-10-15 DIAGNOSIS — IMO0002 Reserved for concepts with insufficient information to code with codable children: Secondary | ICD-10-CM

## 2011-10-26 ENCOUNTER — Ambulatory Visit (HOSPITAL_COMMUNITY)
Admission: RE | Admit: 2011-10-26 | Discharge: 2011-10-26 | Disposition: A | Discharge status: Home / Self Care | Payer: Medicare Other | Source: Ambulatory Visit | Attending: Interventional Radiology | Admitting: Interventional Radiology

## 2011-10-26 DIAGNOSIS — IMO0002 Reserved for concepts with insufficient information to code with codable children: Secondary | ICD-10-CM

## 2011-10-26 DIAGNOSIS — Z09 Encounter for follow-up examination after completed treatment for conditions other than malignant neoplasm: Secondary | ICD-10-CM

## 2011-11-14 ENCOUNTER — Other Ambulatory Visit: Payer: Self-pay

## 2012-01-03 ENCOUNTER — Other Ambulatory Visit: Payer: Self-pay

## 2012-02-06 ENCOUNTER — Encounter (INDEPENDENT_AMBULATORY_CARE_PROVIDER_SITE_OTHER): Payer: Medicare Other | Admitting: *Deleted

## 2012-02-06 ENCOUNTER — Other Ambulatory Visit (INDEPENDENT_AMBULATORY_CARE_PROVIDER_SITE_OTHER): Payer: Medicare Other | Admitting: *Deleted

## 2012-02-06 DIAGNOSIS — I714 Abdominal aortic aneurysm, without rupture: Secondary | ICD-10-CM

## 2012-02-06 DIAGNOSIS — I6529 Occlusion and stenosis of unspecified carotid artery: Secondary | ICD-10-CM

## 2012-02-07 ENCOUNTER — Telehealth: Payer: Self-pay | Admitting: Internal Medicine

## 2012-02-07 NOTE — Telephone Encounter (Signed)
02-07-12 called pt spoke with wife , she was out shopping and pt can't hear well, she will give the office a call back to set up pacer ck with taylor in April/mt

## 2012-02-18 ENCOUNTER — Other Ambulatory Visit: Payer: Self-pay | Admitting: *Deleted

## 2012-02-18 DIAGNOSIS — I714 Abdominal aortic aneurysm, without rupture: Secondary | ICD-10-CM

## 2012-02-18 DIAGNOSIS — I6529 Occlusion and stenosis of unspecified carotid artery: Secondary | ICD-10-CM

## 2012-02-18 DIAGNOSIS — Z48812 Encounter for surgical aftercare following surgery on the circulatory system: Secondary | ICD-10-CM

## 2012-02-19 ENCOUNTER — Encounter: Payer: Self-pay | Admitting: Vascular Surgery

## 2012-02-19 NOTE — Procedures (Unsigned)
CAROTID DUPLEX EXAM  INDICATION:  Followup left CEA.  HISTORY: Diabetes:  No Cardiac:  No Hypertension:  No Smoking:  Previous Previous Surgery:  Left CEA 03/30/2009 CV History: Amaurosis Fugax No, Paresthesias No, Hemiparesis No                                      RIGHT             LEFT Brachial systolic pressure: Brachial Doppler waveforms: Vertebral direction of flow:        Antegrade         Antegrade DUPLEX VELOCITIES (cm/sec) CCA peak systolic                   71                53 ECA peak systolic                   39                53 ICA peak systolic                   55                59 ICA end diastolic                   14                13 PLAQUE MORPHOLOGY:                  Heterogeneous PLAQUE AMOUNT:                      Moderate          N/A PLAQUE LOCATION:                    ICA  IMPRESSION: 1. 1%  to 39% right ICA stenosis. 2. Widely patent left CEA without evidence of restenosis or     hyperplasia. 3. Bilateral vertebral arteries are within normal limits.       ___________________________________________ Quita Skye Hart [place], M.D.  LT/MEDQ  D:  02/07/2012  T:  02/07/2012  Job:  [id number]

## 2012-02-19 NOTE — Procedures (Unsigned)
DUPLEX ULTRASOUND OF ABDOMINAL AORTA  INDICATION:  Followup AAA.  HISTORY: Diabetes:  No Cardiac:  No Hypertension:  No Smoking:  Previous Connective Tissue Disorder: Family History:  No Previous Surgery:  No  DUPLEX EXAM:         AP (cm)                   TRANSVERSE (cm) Proximal             1.86 cm                   1.86 cm Mid                  3.15 cm                   3.18 cm Distal               1.92 cm                   1.95 cm Right Iliac          2.58 cm                   2.52 cm Left Iliac           1.4 cm                    1.46 cm  PREVIOUS:  Date: 10/16/2010  AP:  3.12  TRANSVERSE:  3.23  Previous right iliac 10/16/2010:  2.79 cm x 2.58 cm.  IMPRESSION: 1. Stable AAA measuring 3.15 cm x 3.18 cm on today's exam. 2. Aneurysm of right CIA that measures approximately 2.58 cm x 2.52     cm, which is essentially unchanged from previous exam.  ___________________________________________ Quita Skye. Hart [place], M.D.  LT/MEDQ  D:  02/07/2012  T:  02/07/2012  Job:  [id number]

## 2012-04-10 ENCOUNTER — Encounter: Payer: Self-pay | Admitting: Internal Medicine

## 2012-04-17 ENCOUNTER — Ambulatory Visit (INDEPENDENT_AMBULATORY_CARE_PROVIDER_SITE_OTHER): Payer: Medicare Other | Admitting: Internal Medicine

## 2012-04-17 ENCOUNTER — Encounter: Payer: Self-pay | Admitting: Internal Medicine

## 2012-04-17 VITALS — BP 133/83 | HR 72 | Ht 70.0 in | Wt 119.8 lb

## 2012-04-17 DIAGNOSIS — E7800 Pure hypercholesterolemia, unspecified: Secondary | ICD-10-CM

## 2012-04-17 DIAGNOSIS — Z950 Presence of cardiac pacemaker: Secondary | ICD-10-CM

## 2012-04-17 DIAGNOSIS — I4891 Unspecified atrial fibrillation: Secondary | ICD-10-CM

## 2012-04-17 LAB — PACEMAKER DEVICE OBSERVATION
AL AMPLITUDE: 1.4 mv
AL IMPEDENCE PM: 436 Ohm
AL THRESHOLD: 0.875 V
ATRIAL PACING PM: 100
RV LEAD IMPEDENCE PM: 537 Ohm
RV LEAD THRESHOLD: 0.75 V
VENTRICULAR PACING PM: 5

## 2012-04-17 NOTE — Progress Notes (Signed)
HPI [name] returns today for followup. He is a very pleasant 78 year old man with a history of symptomatic bradycardia status post pacemaker insertion, paroxysmal atrial fibrillation well-controlled on amiodarone, and hypertension. In the interim, he has done well. He denies chest pain, shortness of breath, peripheral edema, or syncope. Allergies  Allergen Reactions  . Penicillins      Current Outpatient Prescriptions  Medication Sig Dispense Refill  . amiodarone (PACERONE) 200 MG tablet Take 200 mg by mouth 2 (two) times daily.        Marland Kitchen aspirin 81 MG tablet Take 81 mg by mouth daily.        Marland Kitchen atenolol (TENORMIN) 50 MG tablet TAKE ONE TABLET BY MOUTH EVERY DAY  90 tablet  3  . cholecalciferol (VITAMIN D-400) 400 UNITS TABS Take 1,000 Units by mouth daily.       Marland Kitchen dronabinol (MARINOL) 2.5 MG capsule Take 2.5 mg by mouth daily.      Marland Kitchen gabapentin (NEURONTIN) 300 MG capsule Take 300 mg by mouth 2 (two) times daily.       Marland Kitchen omeprazole (PRILOSEC) 20 MG capsule Take 20 mg by mouth 2 (two) times daily.       . silodosin (RAPAFLO) 8 MG CAPS capsule Take 8 mg by mouth daily with breakfast.      . traMADol (ULTRAM) 50 MG tablet Take by mouth every 6 (six) hours as needed.      Marland Kitchen acetaminophen (TYLENOL) 325 MG tablet Take 650 mg by mouth every 6 (six) hours as needed.        . Ascorbic Acid (VITAMIN C) 500 MG tablet Take 500 mg by mouth daily.        . calcitonin, salmon, (MIACALCIN/FORTICAL) 200 UNIT/ACT nasal spray 1 spray by Nasal route daily.        . calcium carbonate (OS-CAL) 600 MG TABS Take 600 mg by mouth daily.        Marland Kitchen escitalopram (LEXAPRO) 10 MG tablet Take 5 mg by mouth daily.        Marland Kitchen guaiFENesin (MUCINEX) 600 MG 12 hr tablet Take 1,200 mg by mouth 2 (two) times daily.        Marland Kitchen oxybutynin (DITROPAN) 5 MG tablet 1 tab in the am and 1/2 tab in the pm       . simvastatin (ZOCOR) 40 MG tablet Take 20 mg by mouth at bedtime.           Past Medical History  Diagnosis Date  . HLD  (hyperlipidemia)   . HTN (hypertension)   . Atrial fibrillation   . Arthritis   . GERD (gastroesophageal reflux disease)   . Diverticulitis of colon with bleeding   . Carotid artery stenosis   . Colon polyps   . Kidney stones     ROS:   All systems reviewed and negative except as noted in the HPI.   Past Surgical History  Procedure Date  . Fetal surgery for congenital hernia   . Appendectomy   . Cataract extraction     bilateral  . Shoulder surgery   . Pacemaker insertion   . Carotid endarterectomy   . Kidney stone surgery     removal on right side  . Colonoscopy 2007  . Hernia repair      Family History  Problem Relation Age of Onset  . Diabetes    . Coronary artery disease    . Stroke       History   Social  History  . Marital Status: Married    Spouse Name: N/A    Number of Children: 2  . Years of Education: N/A   Occupational History  . retired    Social History Main Topics  . Smoking status: Former Smoker    Quit date: 12/24/1969  . Smokeless tobacco: Not on file  . Alcohol Use: No  . Drug Use: Not on file  . Sexually Active: Not on file   Other Topics Concern  . Not on file   Social History Narrative  . No narrative on file     BP 133/83  Pulse 72  Ht 5\' 10"  (1.778 m)  Wt 119 lb 12.8 oz (54.341 kg)  BMI 17.19 kg/m2  Physical Exam:  Well appearing elderly man, NAD HEENT: Unremarkable Neck:  No JVD, no thyromegally Lungs:  Clear with no wheezes, rales, or rhonchi. HEART:  Regular rate rhythm, no murmurs, no rubs, no clicks Abd:  soft, positive bowel sounds, no organomegally, no rebound, no guarding Ext:  2 plus pulses, no edema, no cyanosis, no clubbing Skin:  No rashes no nodules Neuro:  CN II through XII intact, motor grossly intact  DEVICE  Normal device function.  See PaceArt for details.   Assess/Plan:

## 2012-04-17 NOTE — Assessment & Plan Note (Signed)
He has maintained normal sinus rhythm greater than 99% of the time. He will continue amiodarone 200 mg daily. I have reviewed his medical records and he is only on 200 mg a day.

## 2012-04-17 NOTE — Assessment & Plan Note (Signed)
His medication profile has been reviewed. He is no longer on simvastatin. At this point, I would recommend no additional cholesterol-lowering medications.

## 2012-04-17 NOTE — Assessment & Plan Note (Signed)
His device is working normally. We'll plan to recheck in several months. 

## 2012-04-17 NOTE — Patient Instructions (Signed)
Your physician wants you to follow-up in: 1 year with Dr. Ladona Ridgel. You will receive a reminder letter in the mail two months in advance. If you don't  receive a letter, please call our office to schedule the follow-up appointment.  DO not take simvastatin or Lipitor.  Continue taking 100mg  of Amiodarone twice daily.

## 2012-07-10 ENCOUNTER — Other Ambulatory Visit: Payer: Self-pay | Admitting: Dermatology

## 2012-09-24 ENCOUNTER — Other Ambulatory Visit: Payer: Self-pay | Admitting: Geriatric Medicine

## 2012-09-24 DIAGNOSIS — R109 Unspecified abdominal pain: Secondary | ICD-10-CM

## 2012-09-25 ENCOUNTER — Encounter: Payer: Self-pay | Admitting: *Deleted

## 2012-09-30 ENCOUNTER — Ambulatory Visit
Admission: RE | Admit: 2012-09-30 | Discharge: 2012-09-30 | Disposition: A | Discharge status: Home / Self Care | Payer: Medicare Other | Source: Ambulatory Visit | Attending: Geriatric Medicine | Admitting: Geriatric Medicine

## 2012-09-30 DIAGNOSIS — R109 Unspecified abdominal pain: Secondary | ICD-10-CM

## 2012-09-30 MED ORDER — IOHEXOL 300 MG/ML  SOLN
80.0000 mL | Freq: Once | INTRAMUSCULAR | Status: AC | PRN
  Administered 2012-09-30: 80 mL via INTRAVENOUS

## 2012-10-02 ENCOUNTER — Ambulatory Visit (INDEPENDENT_AMBULATORY_CARE_PROVIDER_SITE_OTHER): Payer: Medicare Other | Admitting: *Deleted

## 2012-10-02 DIAGNOSIS — I499 Cardiac arrhythmia, unspecified: Secondary | ICD-10-CM

## 2012-10-02 LAB — PACEMAKER DEVICE OBSERVATION
AL AMPLITUDE: 0.35 mv
AL IMPEDENCE PM: 412 Ohm
BATTERY VOLTAGE: 2.76 V
RV LEAD AMPLITUDE: 15.67 mv

## 2012-10-02 NOTE — Progress Notes (Signed)
PPM check 

## 2012-10-20 ENCOUNTER — Encounter: Payer: Self-pay | Admitting: Internal Medicine

## 2012-10-29 ENCOUNTER — Other Ambulatory Visit: Payer: Self-pay | Admitting: *Deleted

## 2012-10-29 MED ORDER — ATENOLOL 50 MG PO TABS: 50.0000 mg | ORAL_TABLET | Freq: Every day | ORAL | Status: DC

## 2012-10-31 ENCOUNTER — Telehealth: Payer: Self-pay | Admitting: Internal Medicine

## 2012-10-31 MED ORDER — ATENOLOL 50 MG PO TABS: 50.0000 mg | ORAL_TABLET | Freq: Every day | ORAL | Status: DC

## 2012-10-31 NOTE — Telephone Encounter (Signed)
New Problem:    Patient's wife called in needing a refill of her husbands atenolol (TENORMIN) 50 MG tablet.  Please call once the order has been placed.

## 2013-01-27 ENCOUNTER — Other Ambulatory Visit: Payer: Medicare Other

## 2013-01-27 ENCOUNTER — Ambulatory Visit: Payer: Medicare Other | Admitting: Vascular Surgery

## 2013-02-03 ENCOUNTER — Other Ambulatory Visit: Payer: Medicare Other

## 2013-02-03 ENCOUNTER — Ambulatory Visit: Payer: Medicare Other | Admitting: Vascular Surgery

## 2013-03-05 ENCOUNTER — Other Ambulatory Visit: Payer: Self-pay | Admitting: *Deleted

## 2013-03-05 DIAGNOSIS — Z48812 Encounter for surgical aftercare following surgery on the circulatory system: Secondary | ICD-10-CM

## 2013-03-17 ENCOUNTER — Ambulatory Visit: Payer: Medicare Other | Admitting: Neurosurgery

## 2013-03-17 ENCOUNTER — Other Ambulatory Visit: Payer: Medicare Other

## 2013-03-31 ENCOUNTER — Ambulatory Visit: Payer: Self-pay | Admitting: Podiatry

## 2013-04-07 ENCOUNTER — Ambulatory Visit: Payer: Self-pay | Admitting: Podiatry

## 2013-04-08 ENCOUNTER — Ambulatory Visit (INDEPENDENT_AMBULATORY_CARE_PROVIDER_SITE_OTHER): Payer: Medicare Other | Admitting: Podiatry

## 2013-04-08 ENCOUNTER — Encounter: Payer: Self-pay | Admitting: Podiatry

## 2013-04-08 VITALS — BP 101/56 | HR 69 | Ht 69.0 in | Wt 112.0 lb

## 2013-04-08 DIAGNOSIS — B351 Tinea unguium: Secondary | ICD-10-CM

## 2013-04-08 DIAGNOSIS — M25579 Pain in unspecified ankle and joints of unspecified foot: Secondary | ICD-10-CM

## 2013-04-08 NOTE — Progress Notes (Signed)
Subjective: 79 y.o. year old male patient presents complaining of painful nails. Patient requests toe nails trimmed. No new problems other than cold feet.  Review of Systems - General ROS: negative for - chills, fatigue, night sweats, sleep disturbance, weight gain or weight loss  Objective: Dermatologic: Thick yellow deformed nails x 10.   Thin and dry skin. No lesions noted. Vascular: Pedal pulses are not palpable. Cold feet.  No edema or erythema. Orthopedic: Contracted lesser digits bilateral. Neurologic: All epicritic and tactile sensations are decreased. Decreased response to Monofilament testing, Vibratory sensation testing bilateral.  Assessment: Dystrophic mycotic nails x 10. PVD Peripheral neuropathy.  Treatment: All mycotic nails debrided.  Return in 3 months or as needed.

## 2013-04-08 NOTE — Patient Instructions (Addendum)
Today trimmed all toe nails. Seen no new problems. Has poor circulation. Return in 3 months.

## 2013-04-23 ENCOUNTER — Encounter: Payer: Medicare Other | Admitting: Internal Medicine

## 2013-05-25 ENCOUNTER — Encounter: Payer: Self-pay | Admitting: Vascular Surgery

## 2013-05-26 ENCOUNTER — Encounter: Payer: Self-pay | Admitting: Internal Medicine

## 2013-05-26 ENCOUNTER — Ambulatory Visit (INDEPENDENT_AMBULATORY_CARE_PROVIDER_SITE_OTHER): Payer: Medicare Other | Admitting: Internal Medicine

## 2013-05-26 ENCOUNTER — Other Ambulatory Visit: Payer: Medicare Other

## 2013-05-26 ENCOUNTER — Ambulatory Visit: Payer: Medicare Other | Admitting: Vascular Surgery

## 2013-05-26 VITALS — BP 113/66 | HR 76 | Ht 68.0 in | Wt 112.0 lb

## 2013-05-26 DIAGNOSIS — Z950 Presence of cardiac pacemaker: Secondary | ICD-10-CM

## 2013-05-26 DIAGNOSIS — I499 Cardiac arrhythmia, unspecified: Secondary | ICD-10-CM

## 2013-05-26 DIAGNOSIS — I4891 Unspecified atrial fibrillation: Secondary | ICD-10-CM

## 2013-05-26 LAB — PACEMAKER DEVICE OBSERVATION
BAMS-0001: 175 {beats}/min
BATTERY VOLTAGE: 2.75 V
VENTRICULAR PACING PM: 6

## 2013-05-26 NOTE — Patient Instructions (Addendum)
Your physician wants you to follow-up in: 6 months in device clinic and 12 months with Dr Taylor You will receive a reminder letter in the mail two months in advance. If you don't receive a letter, please call our office to schedule the follow-up appointment.  

## 2013-05-26 NOTE — Assessment & Plan Note (Signed)
He is maintaining sinus rhythm very nicely on amiodarone therapy. No change in medications at this time.

## 2013-05-26 NOTE — Progress Notes (Signed)
HPI [name] returns today for followup. He is a very pleasant 79 year old man with symptomatic bradycardia, status post permanent pacemaker insertion. He also is a history of hypertension, and he is been maintained in sinus rhythm very nicely on low-dose amiodarone. The patient is quite frail, with multiple spine fractures. He is in severe pain. He has a tendency to fall and is not thought to be a candidate for anticoagulation currently. Previously, he had refused anticoagulation. He is a very difficult time hearing. He is very sedentary. He has chronic back pain. Allergies  Allergen Reactions  . Penicillins   . Streptomycin      Current Outpatient Prescriptions  Medication Sig Dispense Refill  . acetaminophen (TYLENOL) 325 MG tablet Take 650 mg by mouth every 6 (six) hours as needed.        Marland Kitchen amiodarone (PACERONE) 200 MG tablet Take 100 mg by mouth 2 (two) times daily.       . Ascorbic Acid (VITAMIN C) 500 MG tablet Take 500 mg by mouth daily.        Marland Kitchen aspirin 81 MG tablet Take 81 mg by mouth daily.        Marland Kitchen atenolol (TENORMIN) 50 MG tablet Take 1 tablet (50 mg total) by mouth daily.  90 tablet  1  . calcitonin, salmon, (MIACALCIN/FORTICAL) 200 UNIT/ACT nasal spray 1 spray by Nasal route daily.        . calcium carbonate (OS-CAL) 600 MG TABS Take 600 mg by mouth daily.        . cholecalciferol (VITAMIN D-400) 400 UNITS TABS Take 1,000 Units by mouth daily.       Marland Kitchen dronabinol (MARINOL) 2.5 MG capsule Take 2.5 mg by mouth daily.      Marland Kitchen escitalopram (LEXAPRO) 10 MG tablet Take 5 mg by mouth daily.        Marland Kitchen gabapentin (NEURONTIN) 300 MG capsule Take 300 mg by mouth 2 (two) times daily.       Marland Kitchen guaiFENesin (MUCINEX) 600 MG 12 hr tablet Take 1,200 mg by mouth 2 (two) times daily.        Marland Kitchen omeprazole (PRILOSEC) 20 MG capsule Take 20 mg by mouth daily.       Marland Kitchen oxybutynin (DITROPAN) 5 MG tablet 1 tab in the am and 1/2 tab in the pm       . silodosin (RAPAFLO) 8 MG CAPS capsule Take 8 mg by mouth  daily with breakfast.      . simvastatin (ZOCOR) 40 MG tablet Take 20 mg by mouth at bedtime.        . traMADol (ULTRAM) 50 MG tablet Take by mouth every 6 (six) hours as needed.       No current facility-administered medications for this visit.     Past Medical History  Diagnosis Date  . HLD (hyperlipidemia)   . HTN (hypertension)   . Atrial fibrillation   . Arthritis   . GERD (gastroesophageal reflux disease)   . Diverticulitis of colon with bleeding   . Carotid artery stenosis   . Colon polyps   . Kidney stones     ROS:   All systems reviewed and negative except as noted in the HPI.   Past Surgical History  Procedure Laterality Date  . Fetal surgery for congenital hernia    . Appendectomy    . Cataract extraction      bilateral  . Shoulder surgery    . Pacemaker insertion    . Carotid  endarterectomy    . Kidney stone surgery      removal on right side  . Colonoscopy  2007  . Hernia repair       Family History  Problem Relation Age of Onset  . Diabetes    . Coronary artery disease    . Stroke       History   Social History  . Marital Status: Married    Spouse Name: N/A    Number of Children: 2  . Years of Education: N/A   Occupational History  . retired    Social History Main Topics  . Smoking status: Former Smoker    Quit date: 12/24/1969  . Smokeless tobacco: Never Used  . Alcohol Use: No  . Drug Use: Not on file  . Sexually Active: Not on file   Other Topics Concern  . Not on file   Social History Narrative  . No narrative on file     BP 113/66  Pulse 76  Ht 5\' 8"  (1.727 m)  Wt 112 lb (50.803 kg)  BMI 17.03 kg/m2  Physical Exam:  Chronically ill appearing elderly man, NAD HEENT: Unremarkable Neck:  7 cm JVD, no thyromegally Back:  No CVA tenderness Lungs:  Clear with scattered basilar rales, no wheezes, no rhonchi. HEART:  Regular rate rhythm, no murmurs, no rubs, no clicks Abd:  soft, positive bowel sounds, no  organomegally, no rebound, no guarding Ext:  2 plus pulses, no edema, no cyanosis, no clubbing Skin:  No rashes no nodules Neuro:  CN II through XII intact, motor grossly intact  DEVICE  Normal device function.  See PaceArt for details.   Assess/Plan:

## 2013-05-26 NOTE — Assessment & Plan Note (Signed)
His Medtronic dual-chamber pacemaker is working normally. We'll plan to recheck in several months. 

## 2013-06-02 ENCOUNTER — Ambulatory Visit: Payer: Self-pay | Admitting: Podiatry

## 2013-06-03 ENCOUNTER — Ambulatory Visit: Payer: Medicare Other | Admitting: Podiatry

## 2013-06-03 DIAGNOSIS — B351 Tinea unguium: Secondary | ICD-10-CM

## 2013-06-08 DIAGNOSIS — M79673 Pain in unspecified foot: Secondary | ICD-10-CM | POA: Insufficient documentation

## 2013-06-08 DIAGNOSIS — B351 Tinea unguium: Secondary | ICD-10-CM | POA: Insufficient documentation

## 2013-06-08 NOTE — Progress Notes (Signed)
Subjective: 79 year old male patient presents requesting toe nails trimmed. Nails are long and pains to wear closed in shoes. Objective: Neurovascular status are within normal. Orthopedic: Bilateral bunion formation. Dermatologic:  Thick hypertrophic nails x 10. Assessment: Onychomycosis x 10. Painful toes from long nails. Plan: Debrided all nails.

## 2013-07-07 ENCOUNTER — Ambulatory Visit: Payer: Medicare Other | Admitting: Podiatry

## 2013-07-20 ENCOUNTER — Encounter: Payer: Self-pay | Admitting: Vascular Surgery

## 2013-07-21 ENCOUNTER — Encounter (INDEPENDENT_AMBULATORY_CARE_PROVIDER_SITE_OTHER): Payer: Medicare Other | Admitting: *Deleted

## 2013-07-21 ENCOUNTER — Encounter: Payer: Self-pay | Admitting: Vascular Surgery

## 2013-07-21 ENCOUNTER — Ambulatory Visit (INDEPENDENT_AMBULATORY_CARE_PROVIDER_SITE_OTHER): Payer: Medicare Other | Admitting: Vascular Surgery

## 2013-07-21 ENCOUNTER — Other Ambulatory Visit (INDEPENDENT_AMBULATORY_CARE_PROVIDER_SITE_OTHER): Payer: Medicare Other | Admitting: *Deleted

## 2013-07-21 ENCOUNTER — Other Ambulatory Visit: Payer: Self-pay | Admitting: *Deleted

## 2013-07-21 DIAGNOSIS — I714 Abdominal aortic aneurysm, without rupture, unspecified: Secondary | ICD-10-CM

## 2013-07-21 DIAGNOSIS — Z48812 Encounter for surgical aftercare following surgery on the circulatory system: Secondary | ICD-10-CM

## 2013-07-21 DIAGNOSIS — I6529 Occlusion and stenosis of unspecified carotid artery: Secondary | ICD-10-CM

## 2013-07-21 NOTE — Progress Notes (Signed)
Subjective:     Patient ID: [name], male   DOB: Oct 16, 1924, 79 y.o.   MRN: [id number]  HPI this 79 year old male returns for continued followup regarding his carotid occlusive disease and his small bowel aortic aneurysm. He recently had a hospitalization for lower GI bleeding which is still being evaluated. He denies any neurologic symptoms such as lateralizing weakness, aphasia, amaurosis fugax, diplopia, blurred vision, or syncope. He has no history of stroke. It left carotid endarterectomy performed by me several years ago.  Past Medical History  Diagnosis Date  . HLD (hyperlipidemia)   . HTN (hypertension)   . Atrial fibrillation   . Arthritis   . GERD (gastroesophageal reflux disease)   . Diverticulitis of colon with bleeding   . Carotid artery stenosis   . Colon polyps   . Kidney stones     History  Substance Use Topics  . Smoking status: Former Smoker    Types: Cigarettes    Quit date: 12/24/1969  . Smokeless tobacco: Never Used  . Alcohol Use: No    Family History  Problem Relation Age of Onset  . Diabetes    . Coronary artery disease    . Stroke      Allergies  Allergen Reactions  . Penicillins   . Streptomycin     Current outpatient prescriptions:acetaminophen (TYLENOL) 325 MG tablet, Take 650 mg by mouth every 6 (six) hours as needed.  , Disp: , Rfl: ;  amiodarone (PACERONE) 200 MG tablet, Take 100 mg by mouth 2 (two) times daily. , Disp: , Rfl: ;  atenolol (TENORMIN) 50 MG tablet, Take 1 tablet (50 mg total) by mouth daily., Disp: 90 tablet, Rfl: 1;  cholecalciferol (VITAMIN D-400) 400 UNITS TABS, Take 1,000 Units by mouth daily. , Disp: , Rfl:  cholestyramine (QUESTRAN) 4 GM/DOSE powder, Take 4 g by mouth daily., Disp: , Rfl: ;  Cranberry 425 MG CAPS, Take 425 mg by mouth daily., Disp: , Rfl: ;  escitalopram (LEXAPRO) 10 MG tablet, Take 5 mg by mouth daily.  , Disp: , Rfl: ;  gabapentin (NEURONTIN) 300 MG capsule, Take 300 mg by mouth 2 (two) times daily. ,  Disp: , Rfl: ;  Melatonin 3 MG CAPS, Take 3 mg by mouth daily., Disp: , Rfl:  omeprazole (PRILOSEC) 20 MG capsule, Take 20 mg by mouth daily. , Disp: , Rfl: ;  saccharomyces boulardii (FLORASTOR) 250 MG capsule, Take 250 mg by mouth 3 (three) times daily., Disp: , Rfl: ;  silodosin (RAPAFLO) 8 MG CAPS capsule, Take 8 mg by mouth daily with breakfast., Disp: , Rfl: ;  traMADol (ULTRAM) 50 MG tablet, Take by mouth every 6 (six) hours as needed., Disp: , Rfl:  Ascorbic Acid (VITAMIN C) 500 MG tablet, Take 500 mg by mouth daily.  , Disp: , Rfl: ;  aspirin 81 MG tablet, Take 81 mg by mouth daily.  , Disp: , Rfl: ;  calcitonin, salmon, (MIACALCIN/FORTICAL) 200 UNIT/ACT nasal spray, 1 spray by Nasal route daily.  , Disp: , Rfl: ;  calcium carbonate (OS-CAL) 600 MG TABS, Take 600 mg by mouth daily.  , Disp: , Rfl: ;  dronabinol (MARINOL) 2.5 MG capsule, Take 2.5 mg by mouth daily., Disp: , Rfl:  guaiFENesin (MUCINEX) 600 MG 12 hr tablet, Take 1,200 mg by mouth 2 (two) times daily.  , Disp: , Rfl: ;  oxybutynin (DITROPAN) 5 MG tablet, 1 tab in the am and 1/2 tab in the pm , Disp: ,  Rfl: ;  simvastatin (ZOCOR) 40 MG tablet, Take 20 mg by mouth at bedtime.  , Disp: , Rfl:   BP 156/84  Pulse 60  Resp 16  Ht 5\' 8"  (1.727 m)  Wt 110 lb (49.896 kg)  BMI 16.73 kg/m2  Body mass index is 16.73 kg/(m^2).           Review of Systems denies chest pain, dyspnea on exertion, PND, orthopnea, hemoptysis. Does have unsteady gait walks with a walker. Complains of weakness in his arms and legs. Had recent lower GI bleed as noted in history of present illness. Other systems negative and a complete review of systems     Objective:   Physical Exam blood pressure 156/84 heart rate 60 respirations 16 Gen.-alert and oriented x3 in no apparent distress-elderly and feeble HEENT normal for age Lungs no rhonchi or wheezing Cardiovascular regular rhythm no murmurs carotid pulses 3+ palpable no bruits audible Abdomen soft  nontender -3 a half centimeter pulsatile mass-nontender Musculoskeletal free of  major deformities Skin clear -no rashes Neurologic normal Lower extremities 3+ femoral and dorsalis pedis pulses palpable bilaterally with no edema  Data ordered carotid duplex exam which I reviewed and interpreted. There is no evidence of any significant flow reduction for left carotid endarterectomy site or in the right internal carotid artery.  Also ordered a duplex scan of his abdominal aortic aneurysm which are reviewed and interpreted. Maximum diameter of the aneurysm is 3.57 cm slightly up from previous exam 18 months ago at 3.18 cm       Assessment:     Small aortic aneurysm-3.57 cm in no evidence of carotid occlusive disease status post left carotid endarterectomy    Plan:     Return in one year for carotid duplex exam and duplex scan of abdominal aortic aneurysm

## 2013-08-04 ENCOUNTER — Ambulatory Visit (INDEPENDENT_AMBULATORY_CARE_PROVIDER_SITE_OTHER): Payer: Medicare Other | Admitting: Podiatry

## 2013-08-04 DIAGNOSIS — B351 Tinea unguium: Secondary | ICD-10-CM

## 2013-08-04 DIAGNOSIS — M79609 Pain in unspecified limb: Secondary | ICD-10-CM

## 2013-08-04 NOTE — Progress Notes (Signed)
Subjective:  79 year old male patient presents using a wheeled walker requesting toe nails trimmed. Nails are long and pains to wear closed in shoes.  No new problems at this time.   Objective:  Vascular: Pedal pulses were not palpable on both DP and PT bilateral.  No edema or erythema noted. Orthopedic: Bilateral bunion formation.  Dermatologic: Thick hypertrophic nails x 10.  No acute skin lesions noted.  Neurologic: All epicritic and tactile sensations grossly intact.   Assessment: Onychomycosis x 10.  Painful toes from long nails.   Plan: Debrided all nails. Return in 3 months or as needed.

## 2013-11-04 ENCOUNTER — Ambulatory Visit (INDEPENDENT_AMBULATORY_CARE_PROVIDER_SITE_OTHER): Payer: Medicare Other | Admitting: Podiatry

## 2013-11-04 ENCOUNTER — Encounter: Payer: Self-pay | Admitting: Podiatry

## 2013-11-04 VITALS — BP 141/72 | HR 73 | Ht 68.0 in | Wt 112.0 lb

## 2013-11-04 DIAGNOSIS — M79609 Pain in unspecified limb: Secondary | ICD-10-CM

## 2013-11-04 DIAGNOSIS — B351 Tinea unguium: Secondary | ICD-10-CM

## 2013-11-04 NOTE — Progress Notes (Signed)
Subjective:  79 year old male patient presents using a wheeled walker requesting toe nails trimmed. Nails are long and pains to wear closed in shoes.   Objective:  Vascular: Pedal pulses were not palpable on both DP and PT bilateral.  No edema or erythema noted.  Orthopedic: Bilateral bunion formation.  Dermatologic: Thick hypertrophic nails x 10.  No acute skin lesions noted.  Neurologic: All epicritic and tactile sensations grossly intact.   Assessment: Onychomycosis x 10.  Painful toes from long nails.   Plan: Debrided all nails.  Return in 3 months or as needed.

## 2013-11-04 NOTE — Patient Instructions (Signed)
Seen for toe nails. All debrided. Return in 3 months.

## 2013-11-23 ENCOUNTER — Ambulatory Visit (INDEPENDENT_AMBULATORY_CARE_PROVIDER_SITE_OTHER): Payer: Medicare Other | Admitting: *Deleted

## 2013-11-23 DIAGNOSIS — I4891 Unspecified atrial fibrillation: Secondary | ICD-10-CM

## 2013-11-25 ENCOUNTER — Telehealth: Payer: Self-pay | Admitting: Internal Medicine

## 2013-11-25 NOTE — Telephone Encounter (Signed)
New message    Need someone to call and walk them thru checking their pacemaker remotely.  They tried but do not think it worked.

## 2013-11-25 NOTE — Telephone Encounter (Signed)
Spoke w/pt about transmission. Transmission was received.

## 2013-11-29 LAB — MDC_IDC_ENUM_SESS_TYPE_REMOTE
Battery Remaining Longevity: 33 mo
Brady Statistic AP VP Percent: 4 %
Brady Statistic AS VP Percent: 0 %
Brady Statistic AS VS Percent: 0 %
Date Time Interrogation Session: 20141203205138
Lead Channel Impedance Value: 391 Ohm
Lead Channel Impedance Value: 556 Ohm
Lead Channel Pacing Threshold Amplitude: 0.75 V
Lead Channel Sensing Intrinsic Amplitude: 22.4 mV

## 2013-12-08 ENCOUNTER — Encounter: Payer: Self-pay | Admitting: *Deleted

## 2013-12-16 ENCOUNTER — Encounter: Payer: Self-pay | Admitting: Internal Medicine

## 2014-02-09 ENCOUNTER — Other Ambulatory Visit: Payer: Self-pay | Admitting: Vascular Surgery

## 2014-02-09 DIAGNOSIS — I6529 Occlusion and stenosis of unspecified carotid artery: Secondary | ICD-10-CM

## 2014-02-09 DIAGNOSIS — Z48812 Encounter for surgical aftercare following surgery on the circulatory system: Secondary | ICD-10-CM

## 2014-02-10 ENCOUNTER — Ambulatory Visit: Payer: Medicare Other | Admitting: Podiatry

## 2014-02-24 ENCOUNTER — Ambulatory Visit (INDEPENDENT_AMBULATORY_CARE_PROVIDER_SITE_OTHER): Payer: Medicare Other | Admitting: *Deleted

## 2014-02-24 DIAGNOSIS — I4891 Unspecified atrial fibrillation: Secondary | ICD-10-CM

## 2014-02-28 LAB — MDC_IDC_ENUM_SESS_TYPE_REMOTE
Battery Impedance: 1621 Ohm
Brady Statistic AP VP Percent: 5 %
Brady Statistic AP VS Percent: 95 %
Brady Statistic AS VS Percent: 0 %
Date Time Interrogation Session: 20150304190533
Lead Channel Impedance Value: 403 Ohm
Lead Channel Impedance Value: 564 Ohm
Lead Channel Pacing Threshold Amplitude: 0.875 V
Lead Channel Sensing Intrinsic Amplitude: 22.4 mV
Lead Channel Setting Pacing Amplitude: 2.5 V
Lead Channel Setting Sensing Sensitivity: 5.6 mV
MDC IDC MSMT BATTERY REMAINING LONGEVITY: 30 mo
MDC IDC MSMT BATTERY VOLTAGE: 2.74 V
MDC IDC MSMT LEADCHNL RA PACING THRESHOLD AMPLITUDE: 0.75 V
MDC IDC MSMT LEADCHNL RA PACING THRESHOLD PULSEWIDTH: 0.4 ms
MDC IDC MSMT LEADCHNL RV PACING THRESHOLD PULSEWIDTH: 0.4 ms
MDC IDC SET LEADCHNL RA PACING AMPLITUDE: 2 V
MDC IDC SET LEADCHNL RV PACING PULSEWIDTH: 0.4 ms
MDC IDC STAT BRADY AS VP PERCENT: 0 %

## 2014-03-03 ENCOUNTER — Ambulatory Visit (INDEPENDENT_AMBULATORY_CARE_PROVIDER_SITE_OTHER): Payer: Medicare Other | Admitting: Podiatry

## 2014-03-03 ENCOUNTER — Encounter: Payer: Self-pay | Admitting: Podiatry

## 2014-03-03 VITALS — BP 115/60 | HR 65

## 2014-03-03 DIAGNOSIS — B351 Tinea unguium: Secondary | ICD-10-CM

## 2014-03-03 DIAGNOSIS — M7741 Metatarsalgia, right foot: Secondary | ICD-10-CM | POA: Insufficient documentation

## 2014-03-03 DIAGNOSIS — M79606 Pain in leg, unspecified: Secondary | ICD-10-CM | POA: Insufficient documentation

## 2014-03-03 DIAGNOSIS — M7750 Other enthesopathy of unspecified foot: Secondary | ICD-10-CM

## 2014-03-03 DIAGNOSIS — M79609 Pain in unspecified limb: Secondary | ICD-10-CM

## 2014-03-03 NOTE — Patient Instructions (Signed)
Seen for pain in right foot and toe nails.  X-ray taken and found no acute changes or fractures at affected area.  Use pain cream as needed.  All nails debrided.

## 2014-03-03 NOTE — Progress Notes (Signed)
Subjective: 80 year old male presents with his daughter stating that he has pain in right foot and also need nails trimmed.  He walks with aid of walker.   Objective: No open lesions or edema or erythema noted. Positive for thick mycotic nails x 10. Faintly palpable pedal pulses bilateral. Pain upon light pressure to 4th digit and 4th MPJ area right without visible changes.  Radiographic examination of the right foot was done and noted of no fracture.  Significant generalized osteopenia through out the foot was observbed.   Assessment: Pain in lower limb right. Onychomycosis x 10. Neuralgia right forefoot.  Metatarsalgia right foot.   Plan: X-ray findings reviewed. All nails debrided. Prescribed Compounding cream for pain management.

## 2014-03-07 ENCOUNTER — Emergency Department (HOSPITAL_COMMUNITY): Payer: Medicare Other

## 2014-03-07 ENCOUNTER — Inpatient Hospital Stay (HOSPITAL_COMMUNITY)
Admission: EM | Admit: 2014-03-07 | Discharge: 2014-03-12 | DRG: 480 | Disposition: A | Discharge status: Skilled Nursing Facility | Payer: Medicare Other | Attending: Internal Medicine | Admitting: Internal Medicine

## 2014-03-07 ENCOUNTER — Encounter (HOSPITAL_COMMUNITY): Payer: Self-pay | Admitting: Emergency Medicine

## 2014-03-07 DIAGNOSIS — I739 Peripheral vascular disease, unspecified: Secondary | ICD-10-CM | POA: Diagnosis present

## 2014-03-07 DIAGNOSIS — G589 Mononeuropathy, unspecified: Secondary | ICD-10-CM | POA: Diagnosis present

## 2014-03-07 DIAGNOSIS — B351 Tinea unguium: Secondary | ICD-10-CM

## 2014-03-07 DIAGNOSIS — M79673 Pain in unspecified foot: Secondary | ICD-10-CM

## 2014-03-07 DIAGNOSIS — E785 Hyperlipidemia, unspecified: Secondary | ICD-10-CM | POA: Diagnosis present

## 2014-03-07 DIAGNOSIS — Z87442 Personal history of urinary calculi: Secondary | ICD-10-CM

## 2014-03-07 DIAGNOSIS — G8929 Other chronic pain: Secondary | ICD-10-CM | POA: Diagnosis present

## 2014-03-07 DIAGNOSIS — E43 Unspecified severe protein-calorie malnutrition: Secondary | ICD-10-CM | POA: Diagnosis present

## 2014-03-07 DIAGNOSIS — Z79899 Other long term (current) drug therapy: Secondary | ICD-10-CM

## 2014-03-07 DIAGNOSIS — K921 Melena: Secondary | ICD-10-CM

## 2014-03-07 DIAGNOSIS — K922 Gastrointestinal hemorrhage, unspecified: Secondary | ICD-10-CM

## 2014-03-07 DIAGNOSIS — K5731 Diverticulosis of large intestine without perforation or abscess with bleeding: Secondary | ICD-10-CM

## 2014-03-07 DIAGNOSIS — I499 Cardiac arrhythmia, unspecified: Secondary | ICD-10-CM

## 2014-03-07 DIAGNOSIS — I714 Abdominal aortic aneurysm, without rupture, unspecified: Secondary | ICD-10-CM | POA: Diagnosis present

## 2014-03-07 DIAGNOSIS — Z950 Presence of cardiac pacemaker: Secondary | ICD-10-CM

## 2014-03-07 DIAGNOSIS — M7741 Metatarsalgia, right foot: Secondary | ICD-10-CM

## 2014-03-07 DIAGNOSIS — Z66 Do not resuscitate: Secondary | ICD-10-CM | POA: Diagnosis present

## 2014-03-07 DIAGNOSIS — D62 Acute posthemorrhagic anemia: Secondary | ICD-10-CM | POA: Diagnosis not present

## 2014-03-07 DIAGNOSIS — N183 Chronic kidney disease, stage 3 unspecified: Secondary | ICD-10-CM | POA: Diagnosis present

## 2014-03-07 DIAGNOSIS — S72009A Fracture of unspecified part of neck of unspecified femur, initial encounter for closed fracture: Secondary | ICD-10-CM

## 2014-03-07 DIAGNOSIS — K5730 Diverticulosis of large intestine without perforation or abscess without bleeding: Secondary | ICD-10-CM

## 2014-03-07 DIAGNOSIS — D649 Anemia, unspecified: Secondary | ICD-10-CM

## 2014-03-07 DIAGNOSIS — S72002A Fracture of unspecified part of neck of left femur, initial encounter for closed fracture: Secondary | ICD-10-CM

## 2014-03-07 DIAGNOSIS — W19XXXA Unspecified fall, initial encounter: Secondary | ICD-10-CM | POA: Diagnosis present

## 2014-03-07 DIAGNOSIS — D539 Nutritional anemia, unspecified: Secondary | ICD-10-CM

## 2014-03-07 DIAGNOSIS — E7800 Pure hypercholesterolemia, unspecified: Secondary | ICD-10-CM

## 2014-03-07 DIAGNOSIS — K279 Peptic ulcer, site unspecified, unspecified as acute or chronic, without hemorrhage or perforation: Secondary | ICD-10-CM

## 2014-03-07 DIAGNOSIS — Z87891 Personal history of nicotine dependence: Secondary | ICD-10-CM

## 2014-03-07 DIAGNOSIS — R197 Diarrhea, unspecified: Secondary | ICD-10-CM | POA: Diagnosis present

## 2014-03-07 DIAGNOSIS — S72143A Displaced intertrochanteric fracture of unspecified femur, initial encounter for closed fracture: Principal | ICD-10-CM | POA: Diagnosis present

## 2014-03-07 DIAGNOSIS — I6529 Occlusion and stenosis of unspecified carotid artery: Secondary | ICD-10-CM | POA: Diagnosis present

## 2014-03-07 DIAGNOSIS — I4891 Unspecified atrial fibrillation: Secondary | ICD-10-CM | POA: Diagnosis present

## 2014-03-07 DIAGNOSIS — I129 Hypertensive chronic kidney disease with stage 1 through stage 4 chronic kidney disease, or unspecified chronic kidney disease: Secondary | ICD-10-CM | POA: Diagnosis present

## 2014-03-07 DIAGNOSIS — Z8249 Family history of ischemic heart disease and other diseases of the circulatory system: Secondary | ICD-10-CM

## 2014-03-07 DIAGNOSIS — Z681 Body mass index (BMI) 19 or less, adult: Secondary | ICD-10-CM

## 2014-03-07 DIAGNOSIS — Z880 Allergy status to penicillin: Secondary | ICD-10-CM

## 2014-03-07 DIAGNOSIS — K219 Gastro-esophageal reflux disease without esophagitis: Secondary | ICD-10-CM | POA: Diagnosis present

## 2014-03-07 DIAGNOSIS — M129 Arthropathy, unspecified: Secondary | ICD-10-CM | POA: Diagnosis present

## 2014-03-07 DIAGNOSIS — M79606 Pain in leg, unspecified: Secondary | ICD-10-CM

## 2014-03-07 LAB — BASIC METABOLIC PANEL
BUN: 21 mg/dL (ref 6–23)
CALCIUM: 8.9 mg/dL (ref 8.4–10.5)
CO2: 21 mEq/L (ref 19–32)
CREATININE: 1.69 mg/dL — AB (ref 0.50–1.35)
Chloride: 105 mEq/L (ref 96–112)
GFR calc Af Amer: 40 mL/min — ABNORMAL LOW (ref >90)
GFR, EST NON AFRICAN AMERICAN: 35 mL/min — AB (ref >90)
GLUCOSE: 121 mg/dL — AB (ref 70–99)
Potassium: 4.7 mEq/L (ref 3.7–5.3)
SODIUM: 137 meq/L (ref 137–147)

## 2014-03-07 LAB — CBC WITH DIFFERENTIAL/PLATELET
BASOS PCT: 1 % (ref 0–1)
Basophils Absolute: 0.1 10*3/uL (ref 0.0–0.1)
EOS ABS: 0.3 10*3/uL (ref 0.0–0.7)
Eosinophils Relative: 4 % (ref 0–5)
HCT: 35.4 % — ABNORMAL LOW (ref 39.0–52.0)
Hemoglobin: 12 g/dL — ABNORMAL LOW (ref 13.0–17.0)
Lymphocytes Relative: 17 % (ref 12–46)
Lymphs Abs: 1.4 10*3/uL (ref 0.7–4.0)
MCH: 34.4 pg — AB (ref 26.0–34.0)
MCHC: 33.9 g/dL (ref 30.0–36.0)
MCV: 101.4 fL — ABNORMAL HIGH (ref 78.0–100.0)
Monocytes Absolute: 0.7 10*3/uL (ref 0.1–1.0)
Monocytes Relative: 9 % (ref 3–12)
Neutro Abs: 5.9 10*3/uL (ref 1.7–7.7)
Neutrophils Relative %: 70 % (ref 43–77)
PLATELETS: 134 10*3/uL — AB (ref 150–400)
RBC: 3.49 MIL/uL — AB (ref 4.22–5.81)
RDW: 13.2 % (ref 11.5–15.5)
WBC: 8.4 10*3/uL (ref 4.0–10.5)

## 2014-03-07 LAB — PROTIME-INR
INR: 1.03 (ref 0.00–1.49)
PROTHROMBIN TIME: 13.3 s (ref 11.6–15.2)

## 2014-03-07 MED ORDER — FENTANYL CITRATE 0.05 MG/ML IJ SOLN
50.0000 ug | INTRAMUSCULAR | Status: AC | PRN
  Administered 2014-03-07 (×2): 50 ug via INTRAVENOUS
  Filled 2014-03-07 (×2): qty 2

## 2014-03-07 NOTE — ED Provider Notes (Signed)
CSN: [id number]     Arrival date & time 03/07/14  2119 History   First MD Initiated Contact with Patient 03/07/14 2120     Chief Complaint  Patient presents with  . Fall     (Consider location/radiation/quality/duration/timing/severity/associated sxs/prior Treatment) Patient is a 80 y.o. male presenting with fall.  Fall   Pt with history of multiple medical problems was sitting on a stool in the bathroom just prior to arrival when he fell backwards on to the floor, landing on his L hip. There was a question of head injury but no LOC and no headache now. He is complaining of moderate aching pain in L hip, worse with movement.   Past Medical History  Diagnosis Date  . HLD (hyperlipidemia)   . HTN (hypertension)   . Atrial fibrillation   . Arthritis   . GERD (gastroesophageal reflux disease)   . Diverticulitis of colon with bleeding   . Carotid artery stenosis   . Colon polyps   . Kidney stones    Past Surgical History  Procedure Laterality Date  . Fetal surgery for congenital hernia    . Appendectomy    . Cataract extraction      bilateral  . Shoulder surgery    . Pacemaker insertion    . Carotid endarterectomy    . Kidney stone surgery      removal on right side  . Colonoscopy  2007  . Hernia repair     Family History  Problem Relation Age of Onset  . Diabetes    . Coronary artery disease    . Stroke     History  Substance Use Topics  . Smoking status: Former Smoker    Types: Cigarettes    Quit date: 12/24/1969  . Smokeless tobacco: Never Used  . Alcohol Use: No    Review of Systems All other systems reviewed and are negative except as noted in HPI.     Allergies  Penicillins and Streptomycin  Home Medications   Current Outpatient Rx  Name  Route  Sig  Dispense  Refill  . acetaminophen (TYLENOL) 325 MG tablet   Oral   Take 650 mg by mouth every 6 (six) hours as needed.           Marland Kitchen. amiodarone (PACERONE) 200 MG tablet   Oral   Take 100 mg by  mouth 2 (two) times daily.          Marland Kitchen. atenolol (TENORMIN) 50 MG tablet   Oral   Take 50 mg by mouth daily. Half tab daily         . Cranberry 425 MG CAPS   Oral   Take 425 mg by mouth daily.         Marland Kitchen. gabapentin (NEURONTIN) 300 MG capsule   Oral   Take 300 mg by mouth 2 (two) times daily.          . Melatonin 3 MG CAPS   Oral   Take 3 mg by mouth daily.         Marland Kitchen. omeprazole (PRILOSEC) 20 MG capsule   Oral   Take 20 mg by mouth daily.          Marland Kitchen. saccharomyces boulardii (FLORASTOR) 250 MG capsule   Oral   Take 250 mg by mouth 3 (three) times daily.         . traMADol (ULTRAM) 50 MG tablet   Oral   Take by mouth every 6 (six) hours as  needed.          SpO2 94% Physical Exam  Nursing note and vitals reviewed. Constitutional: He is oriented to person, place, and time. He appears well-developed and well-nourished.  HENT:  Head: Normocephalic and atraumatic.  Eyes: EOM are normal. Pupils are equal, round, and reactive to light.  Neck: Normal range of motion. Neck supple.  Cardiovascular: Normal rate, normal heart sounds and intact distal pulses.   Pulmonary/Chest: Effort normal and breath sounds normal.  Abdominal: Bowel sounds are normal. He exhibits no distension. There is no tenderness.  Musculoskeletal: He exhibits tenderness. He exhibits no edema.  L leg shortened and externally rotated, pain with ROM, NVI  Neurological: He is alert and oriented to person, place, and time. He has normal strength. No cranial nerve deficit or sensory deficit.  Skin: Skin is warm and dry. No rash noted.  Psychiatric: He has a normal mood and affect.    ED Course  Procedures (including critical care time) Labs Review Labs Reviewed  BASIC METABOLIC PANEL - Abnormal; Notable for the following:    Glucose, Bld 121 (*)    Creatinine, Ser 1.69 (*)    GFR calc non Af Amer 35 (*)    GFR calc Af Amer 40 (*)    All other components within normal limits  CBC WITH  DIFFERENTIAL - Abnormal; Notable for the following:    RBC 3.49 (*)    Hemoglobin 12.0 (*)    HCT 35.4 (*)    MCV 101.4 (*)    MCH 34.4 (*)    Platelets 134 (*)    All other components within normal limits  PROTIME-INR  TYPE AND SCREEN   Imaging Review Dg Chest 1 View  03/07/2014   CLINICAL DATA:  Status post fall.  Left hip fracture.  EXAM: CHEST - 1 VIEW  COMPARISON:  PA and lateral chest 09/24/2012.  FINDINGS: Lung volumes are low but the lungs are clear. Heart size is normal. Pacing device is in place. No pneumothorax or pleural effusion. Multiple thoracic compression fracture are identified with postoperative change of vertebral augmentation noted.  IMPRESSION: No acute disease.   Electronically Signed   By: Drusilla Kanner M.D.   On: 03/07/2014 23:06   Dg Hip Complete Left  03/07/2014   CLINICAL DATA:  Status post fall.  Left hip pain.  EXAM: LEFT HIP - COMPLETE 2+ VIEW  COMPARISON:  None.  FINDINGS: There is an acute left intertrochanteric fracture. No other acute bony or joint abnormality is identified. Bones are osteopenic. The patient is status post vertebral augmentation lower lumbar spine.  IMPRESSION: Acute left intertrochanteric fracture.   Electronically Signed   By: Drusilla Kanner M.D.   On: 03/07/2014 23:05     EKG Interpretation   Date/Time:  Sunday March 07 2014 22:04:26 EDT Ventricular Rate:  66 PR Interval:  213 QRS Duration: 102 QT Interval:  499 QTC Calculation: 523 R Axis:   13 Text Interpretation:  Sinus rhythm Borderline prolonged PR interval  Prolonged QT interval No significant change since last tracing Confirmed  by [facility]  MD, Leonette Most [phone]) on 03/07/2014 10:11:17 PM      MDM   Final diagnoses:  Hip fracture, left    Pt with hip fracture on xray. Discussed with Dr. Roda Shutters and with Dr. Allena Katz on call for Ortho and Hospitalists respectively who will admit the patient for further eval.     Leonette Most B. Bernette Mayers, MD 03/07/14 2355

## 2014-03-07 NOTE — ED Notes (Signed)
[place] [facility] Fell from stool of unknown height landing on left hip. EMS reported patient had external rotation of left leg with intact pulses. Pain was controlled without intervention assuming patient was not being moved.

## 2014-03-08 ENCOUNTER — Inpatient Hospital Stay (HOSPITAL_COMMUNITY): Payer: Medicare Other

## 2014-03-08 DIAGNOSIS — E43 Unspecified severe protein-calorie malnutrition: Secondary | ICD-10-CM

## 2014-03-08 DIAGNOSIS — E7800 Pure hypercholesterolemia, unspecified: Secondary | ICD-10-CM

## 2014-03-08 LAB — BASIC METABOLIC PANEL
BUN: 23 mg/dL (ref 6–23)
CHLORIDE: 102 meq/L (ref 96–112)
CO2: 20 meq/L (ref 19–32)
CREATININE: 1.64 mg/dL — AB (ref 0.50–1.35)
Calcium: 8.9 mg/dL (ref 8.4–10.5)
GFR calc non Af Amer: 36 mL/min — ABNORMAL LOW (ref >90)
GFR, EST AFRICAN AMERICAN: 42 mL/min — AB (ref >90)
Glucose, Bld: 180 mg/dL — ABNORMAL HIGH (ref 70–99)
Potassium: 4.9 mEq/L (ref 3.7–5.3)
SODIUM: 137 meq/L (ref 137–147)

## 2014-03-08 LAB — SURGICAL PCR SCREEN
MRSA, PCR: NEGATIVE
STAPHYLOCOCCUS AUREUS: NEGATIVE

## 2014-03-08 LAB — CBC
HCT: 33.4 % — ABNORMAL LOW (ref 39.0–52.0)
Hemoglobin: 11 g/dL — ABNORMAL LOW (ref 13.0–17.0)
MCH: 33.7 pg (ref 26.0–34.0)
MCHC: 32.9 g/dL (ref 30.0–36.0)
MCV: 102.5 fL — ABNORMAL HIGH (ref 78.0–100.0)
Platelets: 122 10*3/uL — ABNORMAL LOW (ref 150–400)
RBC: 3.26 MIL/uL — AB (ref 4.22–5.81)
RDW: 13.3 % (ref 11.5–15.5)
WBC: 15.9 10*3/uL — AB (ref 4.0–10.5)

## 2014-03-08 MED ORDER — AMIODARONE HCL 100 MG PO TABS
100.0000 mg | ORAL_TABLET | Freq: Two times a day (BID) | ORAL | Status: DC
  Administered 2014-03-08 – 2014-03-12 (×8): 100 mg via ORAL
  Filled 2014-03-08 (×11): qty 1

## 2014-03-08 MED ORDER — HYDROCODONE-ACETAMINOPHEN 5-325 MG PO TABS
1.0000 | ORAL_TABLET | Freq: Four times a day (QID) | ORAL | Status: DC | PRN
  Filled 2014-03-08: qty 1

## 2014-03-08 MED ORDER — CHLORHEXIDINE GLUCONATE 4 % EX LIQD
60.0000 mL | Freq: Once | CUTANEOUS | Status: AC
  Administered 2014-03-09: 4 via TOPICAL
  Filled 2014-03-08: qty 60

## 2014-03-08 MED ORDER — HEPARIN SODIUM (PORCINE) 5000 UNIT/ML IJ SOLN
5000.0000 [IU] | Freq: Three times a day (TID) | INTRAMUSCULAR | Status: DC
  Administered 2014-03-08 (×3): 5000 [IU] via SUBCUTANEOUS
  Filled 2014-03-08 (×7): qty 1

## 2014-03-08 MED ORDER — CHOLESTYRAMINE 4 G PO PACK
2.0000 g | PACK | Freq: Every day | ORAL | Status: DC
  Administered 2014-03-10 – 2014-03-12 (×2): 2 g via ORAL
  Filled 2014-03-08 (×5): qty 1

## 2014-03-08 MED ORDER — FENTANYL CITRATE 0.05 MG/ML IJ SOLN
12.5000 ug | INTRAMUSCULAR | Status: DC | PRN
  Administered 2014-03-08: 25 ug via INTRAVENOUS
  Filled 2014-03-08: qty 2

## 2014-03-08 MED ORDER — GABAPENTIN 300 MG PO CAPS
300.0000 mg | ORAL_CAPSULE | Freq: Two times a day (BID) | ORAL | Status: DC
  Administered 2014-03-08 – 2014-03-12 (×8): 300 mg via ORAL
  Filled 2014-03-08 (×10): qty 1

## 2014-03-08 MED ORDER — ATENOLOL 25 MG PO TABS
25.0000 mg | ORAL_TABLET | Freq: Every day | ORAL | Status: DC
  Administered 2014-03-08 – 2014-03-12 (×4): 25 mg via ORAL
  Filled 2014-03-08 (×5): qty 1

## 2014-03-08 MED ORDER — SODIUM CHLORIDE 0.9 % IV SOLN
INTRAVENOUS | Status: DC
Start: 2014-03-08 — End: 2014-03-10

## 2014-03-08 MED ORDER — PANTOPRAZOLE SODIUM 40 MG PO TBEC
40.0000 mg | DELAYED_RELEASE_TABLET | Freq: Every day | ORAL | Status: DC
  Administered 2014-03-08 – 2014-03-12 (×4): 40 mg via ORAL
  Filled 2014-03-08 (×3): qty 1

## 2014-03-08 MED ORDER — SODIUM CHLORIDE 0.9 % IV SOLN
INTRAVENOUS | Status: AC
  Administered 2014-03-08: 02:00:00 via INTRAVENOUS

## 2014-03-08 MED ORDER — TRAMADOL HCL 50 MG PO TABS
50.0000 mg | ORAL_TABLET | Freq: Four times a day (QID) | ORAL | Status: DC | PRN
  Administered 2014-03-08 – 2014-03-09 (×2): 50 mg via ORAL
  Filled 2014-03-08 (×2): qty 1

## 2014-03-08 MED ORDER — SODIUM CHLORIDE 0.9 % IV SOLN
INTRAVENOUS | Status: DC
  Administered 2014-03-09: 06:00:00 via INTRAVENOUS

## 2014-03-08 MED ORDER — CLINDAMYCIN PHOSPHATE 900 MG/50ML IV SOLN
900.0000 mg | INTRAVENOUS | Status: AC
  Administered 2014-03-09: 900 mg via INTRAVENOUS
  Filled 2014-03-08: qty 50

## 2014-03-08 NOTE — Progress Notes (Addendum)
PROGRESS NOTE  [name]:[id number] DOB: 06-09-1924 DOA: 03/07/2014 PCP: Ginette Otto, MD  Assessment/Plan: Hip fracture  -acute left intertrochanteric fracture of femur.  -Orthopedics has been consulted for surgery: surgery scheduled for tomm AM- will make patient NPO after midnight -n.p.o.,  -IV fentanyl  -Gentle IV hydration.  Repeat labs in the morning.   neuropathy  Continue gabapentin and tramadol   atrial fibrillation  Continue the amiodarone, at present and normal sinus rhythm  Not on any anticoagulation due to GI bleed   carotid artery stenosis , AAA  continue to monitor, not on any antiplatelets due to GI bleed.  Continue atenolol   chronic diarrhea  Continue Cholestyramine  Severe malnutrition   Code Status: dnr Family Communication: daughter at bedside Disposition Plan: admit   Consultants:  ortho  Procedures:     HPI/Subjective: Asking for water Pain in hip  Objective: Filed Vitals:   03/08/14 0800  BP:   Pulse:   Temp:   Resp: 17   No intake or output data in the 24 hours ending 03/08/14 1050 Filed Weights   03/07/14 2129 03/08/14 0053  Weight: 50.803 kg (112 lb) 52.9 kg (116 lb 10 oz)    Exam:  General: chronically ill appearing, hard of hearing  Cardiovascular: S1 and S2 Present, no Murmur, Peripheral Pulses Present  Respiratory: Bilateral Air entry equal and Decreased, no Crackles,no wheezes  Abdomen: Bowel Sound Present, Soft and Non tender  Extremities: no Pedal edema, no calf tenderness, pulses palpable      Data Reviewed: Basic Metabolic Panel:  Recent Labs Lab 03/07/14 2148 03/08/14 0509  NA 137 137  K 4.7 4.9  CL 105 102  CO2 21 20  GLUCOSE 121* 180*  BUN 21 23  CREATININE 1.69* 1.64*  CALCIUM 8.9 8.9   Liver Function Tests: No results found for this basename: AST, ALT, ALKPHOS, BILITOT, PROT, ALBUMIN,  in the last 168 hours No results found for this basename: LIPASE, AMYLASE,  in the last  168 hours No results found for this basename: AMMONIA,  in the last 168 hours CBC:  Recent Labs Lab 03/07/14 2148 03/08/14 0509  WBC 8.4 15.9*  NEUTROABS 5.9  --   HGB 12.0* 11.0*  HCT 35.4* 33.4*  MCV 101.4* 102.5*  PLT 134* 122*   Cardiac Enzymes: No results found for this basename: CKTOTAL, CKMB, CKMBINDEX, TROPONINI,  in the last 168 hours BNP (last 3 results) No results found for this basename: PROBNP,  in the last 8760 hours CBG: No results found for this basename: GLUCAP,  in the last 168 hours  Recent Results (from the past 240 hour(s))  SURGICAL PCR SCREEN     Status: None   Collection Time    03/08/14  2:57 AM      Result Value Ref Range Status   MRSA, PCR NEGATIVE  NEGATIVE Final   Staphylococcus aureus NEGATIVE  NEGATIVE Final   Comment:            The Xpert SA Assay (FDA     approved for NASAL specimens     in patients over 78 years of age),     is one component of     a comprehensive surveillance     program.  Test performance has     been validated by [employer] for patients greater     than or equal to 40 year old.     It is not intended  to diagnose infection nor to     guide or monitor treatment.     Studies: Dg Chest 1 View  03/07/2014   CLINICAL DATA:  Status post fall.  Left hip fracture.  EXAM: CHEST - 1 VIEW  COMPARISON:  PA and lateral chest 09/24/2012.  FINDINGS: Lung volumes are low but the lungs are clear. Heart size is normal. Pacing device is in place. No pneumothorax or pleural effusion. Multiple thoracic compression fracture are identified with postoperative change of vertebral augmentation noted.  IMPRESSION: No acute disease.   Electronically Signed   By: Drusilla Kanner M.D.   On: 03/07/2014 23:06   Dg Hip Complete Left  03/07/2014   CLINICAL DATA:  Status post fall.  Left hip pain.  EXAM: LEFT HIP - COMPLETE 2+ VIEW  COMPARISON:  None.  FINDINGS: There is an acute left intertrochanteric fracture. No other acute bony or joint  abnormality is identified. Bones are osteopenic. The patient is status post vertebral augmentation lower lumbar spine.  IMPRESSION: Acute left intertrochanteric fracture.   Electronically Signed   By: Drusilla Kanner M.D.   On: 03/07/2014 23:05   Ct Head Wo Contrast  03/08/2014   CLINICAL DATA:  Status post fall.  EXAM: CT HEAD WITHOUT CONTRAST  CT CERVICAL SPINE WITHOUT CONTRAST  TECHNIQUE: Multidetector CT imaging of the head and cervical spine was performed following the standard protocol without intravenous contrast. Multiplanar CT image reconstructions of the cervical spine were also generated.  COMPARISON:  Head CT scan 08/26/2010.  FINDINGS: CT HEAD FINDINGS  There is cortical atrophy and chronic microvascular ischemic change. No evidence of acute abnormality including infarction, hemorrhage, mass lesion, mass effect, midline shift or abnormal extra-axial fluid collection is identified. There is a small radiopaque foreign body in the subcutaneous tissues anterior to the frontal bone, unchanged.  CT CERVICAL SPINE FINDINGS  No fracture or malalignment of the cervical spine is identified. Multilevel degenerative change is noted. Lung apices are clear.  IMPRESSION: No acute finding head or cervical spine.   Electronically Signed   By: Drusilla Kanner M.D.   On: 03/08/2014 02:51   Ct Cervical Spine Wo Contrast  03/08/2014   CLINICAL DATA:  Status post fall.  EXAM: CT HEAD WITHOUT CONTRAST  CT CERVICAL SPINE WITHOUT CONTRAST  TECHNIQUE: Multidetector CT imaging of the head and cervical spine was performed following the standard protocol without intravenous contrast. Multiplanar CT image reconstructions of the cervical spine were also generated.  COMPARISON:  Head CT scan 08/26/2010.  FINDINGS: CT HEAD FINDINGS  There is cortical atrophy and chronic microvascular ischemic change. No evidence of acute abnormality including infarction, hemorrhage, mass lesion, mass effect, midline shift or abnormal  extra-axial fluid collection is identified. There is a small radiopaque foreign body in the subcutaneous tissues anterior to the frontal bone, unchanged.  CT CERVICAL SPINE FINDINGS  No fracture or malalignment of the cervical spine is identified. Multilevel degenerative change is noted. Lung apices are clear.  IMPRESSION: No acute finding head or cervical spine.   Electronically Signed   By: Drusilla Kanner M.D.   On: 03/08/2014 02:51    Scheduled Meds: . sodium chloride   Intravenous STAT  . amiodarone  100 mg Oral BID  . atenolol  25 mg Oral Daily  . cholestyramine  2 g Oral Daily  . gabapentin  300 mg Oral BID  . heparin  5,000 Units Subcutaneous 3 times per day  . pantoprazole  40 mg Oral Daily  Continuous Infusions: . sodium chloride     Antibiotics Given (last 72 hours)   None      Principal Problem:   Hip fracture Active Problems:   NEUROPATHY   Atrial fibrillation   CAROTID ARTERY STENOSIS   ABDOMINAL AORTIC ANEURYSM   PPM-[employer]    Time spent: 35 min    Kinston Magnan  [facility] Pager [phone]. If 7PM-7AM, please contact night-coverage at [email or web address], password [email or web address] 03/08/2014, 10:50 AM  LOS: 1 day

## 2014-03-08 NOTE — Progress Notes (Signed)
Will plan for surgery Tuesday am at 7:30.  N. Glee ArvinMichael Xu, MD [facility] [phone] 11:02 AM

## 2014-03-08 NOTE — H&P (Signed)
H&P update  The surgical history has been reviewed and remains accurate without interval change.  The patient was re-examined and patient's physiologic condition has not changed significantly in the last 30 days. The condition still exists that makes this procedure necessary. The treatment plan remains the same, without new options for care.  No new pharmacological allergies or types of therapy has been initiated that would change the plan or the appropriateness of the plan.  The patient and/or family understand the potential benefits and risks.  [name]. Michael Xu, MD 03/08/2014 5:08 PM

## 2014-03-08 NOTE — Progress Notes (Signed)
Orthopedic Tech Progress Note Patient Details:  [name] [birth year] [id number]  Patient ID: [name], male   DOB: [birth year], [age] y.o.   MRN: [id number]   Shawnie PonsCammer, Delisha Peaden Carol 03/08/2014, 4:28 PMTrapeze bar

## 2014-03-08 NOTE — Progress Notes (Signed)
INITIAL NUTRITION ASSESSMENT  DOCUMENTATION CODES Per approved criteria  -Underweight -Severe malnutrition in the context of social or environmental circumstances.    INTERVENTION:  - Recommend liberalize diet to regular (paged MD)   - Carnation Instant Breakfast Vanilla mixed with lactose free milk TID between meals.   - High Calorie/ High Protein Foods List Handout provided to daughter   NUTRITION DIAGNOSIS: Malnutrition related to very poor appetite, possibly dementia as evidenced by intake </= 50% in >/= 1 month and severe fat and muscle wasting.   Goal: - Prevent further weight loss   Monitor:  - Oral Intake   - Weight status   Reason for Assessment: MD Consult.   80 y.o. male  Admitting Dx: Hip fracture  ASSESSMENT: [name] is a 80 y.o. male who complains of left hip fracture s/p being found on floor by daughter. Spoke with daughter who was in the room when talking with patient and she says he has no appetite and has to be force fed to eat anything. Pt has been on appetite stimulant (marinol) PTA and daughter explained that this did not help his appetite. Daughter explained that he is lactose intolerant and does not tolerate Ensure or Boost. Daughter seemed well aware of high protein/high calorie foods; handout given to daughter. Daughter explained that wife will not let pt eat outside the kitchen in the living room and will not let him eat chocolate. Pt is experiencing chronic diarrhea, pt is now on cholestyramine and per daughter has helped. Pt still has loose stools which he is incontinent.    Nutrition Focused Physical Exam:  Subcutaneous Fat:  Orbital Region: severe wasting Upper Arm Region: severe wasting Thoracic and Lumbar Region: severe wasting  Muscle:  Temple Region: severe wasting Clavicle Bone Region:severe wasting Clavicle and Acromion Bone Region: severe wasting Scapular Bone Region: N/A Dorsal Hand: severe wasting Patellar Region: severe  wasting Anterior Thigh Region: severe wasting Posterior Calf Region: severe wasting   Edema: Not present    Height: Ht Readings from Last 1 Encounters:  03/08/14 5\' 10"  (1.778 m)    Weight: Wt Readings from Last 1 Encounters:  03/08/14 116 lb 10 oz (52.9 kg)    Ideal Body Weight: 75.5 kg   % Ideal Body Weight: 70%   Wt Readings from Last 10 Encounters:  03/08/14 116 lb 10 oz (52.9 kg)  11/04/13 112 lb (50.803 kg)  07/21/13 110 lb (49.896 kg)  05/26/13 112 lb (50.803 kg)  04/08/13 112 lb (50.803 kg)  04/17/12 119 lb 12.8 oz (54.341 kg)  03/12/11 116 lb 8 oz (52.844 kg)  03/29/10 118 lb (53.524 kg)  01/17/10 112 lb 8 oz (51.03 kg)  12/12/09 107 lb (48.535 kg)    Usual Body Weight: 50.9 kg (112 lbs)   % Usual Body Weight: 104%  BMI:  Body mass index is 16.73 kg/(m^2).  Estimated Nutritional Needs: Kcal: 1300 - 1500   Protein: 60g - 70g  Fluid: 1500 mL    Skin: L Elbow Wound.  Diet Order: Cardiac  EDUCATION NEEDS: -Education needs addressed  No intake or output data in the 24 hours ending 03/08/14 1337  Last BM: PTA   Labs: No results found for this basename: HGBA1C   Lipid Panel  No results found for this basename: chol,  trig,  hdl,  cholhdl,  vldl,  ldlcalc    Recent Labs Lab 03/07/14 2148 03/08/14 0509  NA 137 137  K 4.7 4.9  CL 105 102  CO2 21 20  BUN 21 23  CREATININE 1.69* 1.64*  CALCIUM 8.9 8.9  GLUCOSE 121* 180*    CBG (last 3)  No results found for this basename: GLUCAP,  in the last 72 hours  Scheduled Meds: . sodium chloride   Intravenous STAT  . amiodarone  100 mg Oral BID  . atenolol  25 mg Oral Daily  . cholestyramine  2 g Oral Daily  . gabapentin  300 mg Oral BID  . heparin  5,000 Units Subcutaneous 3 times per day  . pantoprazole  40 mg Oral Daily    Continuous Infusions: . sodium chloride      Past Medical History  Diagnosis Date  . HLD (hyperlipidemia)   . HTN (hypertension)   . Atrial fibrillation   .  Arthritis   . GERD (gastroesophageal reflux disease)   . Diverticulitis of colon with bleeding   . Carotid artery stenosis   . Colon polyps   . Kidney stones     Past Surgical History  Procedure Laterality Date  . Fetal surgery for congenital hernia    . Appendectomy    . Cataract extraction      bilateral  . Shoulder surgery    . Pacemaker insertion    . Carotid endarterectomy    . Kidney stone surgery      removal on right side  . Colonoscopy  2007  . Hernia repair      Carloyn Mannerebekah Matznick, BS Nutrition Intern  Intern note/chart reviewed. Revisions made.  Kendell BaneHeather Orval Dortch RD, LDN, CNSC [phone] Pager [phone] After Hours Pager

## 2014-03-08 NOTE — Consult Note (Signed)
ORTHOPAEDIC CONSULTATION  REQUESTING PHYSICIAN: Geradine Girt, DO  Chief Complaint: Left hip fracture  HPI: [name] is a 79 y.o. male who complains of left hip fracture s/p being found on floor by daughter.  Denies LOC, neck pain, abd pain.  Has h/o GI bleed from aspirin.  Denies any other complaints.  Past Medical History  Diagnosis Date  . HLD (hyperlipidemia)   . HTN (hypertension)   . Atrial fibrillation   . Arthritis   . GERD (gastroesophageal reflux disease)   . Diverticulitis of colon with bleeding   . Carotid artery stenosis   . Colon polyps   . Kidney stones    Past Surgical History  Procedure Laterality Date  . Fetal surgery for congenital hernia    . Appendectomy    . Cataract extraction      bilateral  . Shoulder surgery    . Pacemaker insertion    . Carotid endarterectomy    . Kidney stone surgery      removal on right side  . Colonoscopy  2007  . Hernia repair     History   Social History  . Marital Status: Married    Spouse Name: N/A    Number of Children: 2  . Years of Education: N/A   Occupational History  . retired    Social History Main Topics  . Smoking status: Former Smoker    Types: Cigarettes    Quit date: 12/24/1969  . Smokeless tobacco: Never Used  . Alcohol Use: No  . Drug Use: No  . Sexual Activity: None   Other Topics Concern  . None   Social History Narrative  . None   Family History  Problem Relation Age of Onset  . Diabetes    . Coronary artery disease    . Stroke     Allergies  Allergen Reactions  . Penicillins Anaphylaxis  . Streptomycin Anaphylaxis  . Lactose Intolerance (Gi)    Prior to Admission medications   Medication Sig Start Date End Date Taking? Authorizing Provider  acetaminophen (TYLENOL) 325 MG tablet Take 650 mg by mouth every 6 (six) hours as needed.     Yes Historical Provider, MD  amiodarone (PACERONE) 200 MG tablet Take 100 mg by mouth 2 (two) times daily.    Yes Historical  Provider, MD  atenolol (TENORMIN) 50 MG tablet Take 25 mg by mouth daily. Half tab daily 10/31/12  Yes Evans Lance, MD  cholestyramine Lucrezia Starch) 4 GM/DOSE powder Take 2 g by mouth daily.   Yes Historical Provider, MD  Cranberry 425 MG CAPS Take 425 mg by mouth daily.   Yes Historical Provider, MD  gabapentin (NEURONTIN) 300 MG capsule Take 300 mg by mouth 2 (two) times daily.    Yes Historical Provider, MD  Melatonin 3 MG CAPS Take 3 mg by mouth daily.   Yes Historical Provider, MD  omeprazole (PRILOSEC) 20 MG capsule Take 20 mg by mouth daily.    Yes Historical Provider, MD  saccharomyces boulardii (FLORASTOR) 250 MG capsule Take 250 mg by mouth 3 (three) times daily.   Yes Historical Provider, MD  traMADol (ULTRAM) 50 MG tablet Take by mouth every 6 (six) hours as needed.   Yes Historical Provider, MD   Dg Chest 1 View  03/07/2014   CLINICAL DATA:  Status post fall.  Left hip fracture.  EXAM: CHEST - 1 VIEW  COMPARISON:  PA and lateral chest 09/24/2012.  FINDINGS: Lung volumes are low  but the lungs are clear. Heart size is normal. Pacing device is in place. No pneumothorax or pleural effusion. Multiple thoracic compression fracture are identified with postoperative change of vertebral augmentation noted.  IMPRESSION: No acute disease.   Electronically Signed   By: Inge Rise M.D.   On: 03/07/2014 23:06   Dg Hip Complete Left  03/07/2014   CLINICAL DATA:  Status post fall.  Left hip pain.  EXAM: LEFT HIP - COMPLETE 2+ VIEW  COMPARISON:  None.  FINDINGS: There is an acute left intertrochanteric fracture. No other acute bony or joint abnormality is identified. Bones are osteopenic. The patient is status post vertebral augmentation lower lumbar spine.  IMPRESSION: Acute left intertrochanteric fracture.   Electronically Signed   By: Inge Rise M.D.   On: 03/07/2014 23:05   Ct Head Wo Contrast  03/08/2014   CLINICAL DATA:  Status post fall.  EXAM: CT HEAD WITHOUT CONTRAST  CT CERVICAL  SPINE WITHOUT CONTRAST  TECHNIQUE: Multidetector CT imaging of the head and cervical spine was performed following the standard protocol without intravenous contrast. Multiplanar CT image reconstructions of the cervical spine were also generated.  COMPARISON:  Head CT scan 08/26/2010.  FINDINGS: CT HEAD FINDINGS  There is cortical atrophy and chronic microvascular ischemic change. No evidence of acute abnormality including infarction, hemorrhage, mass lesion, mass effect, midline shift or abnormal extra-axial fluid collection is identified. There is a small radiopaque foreign body in the subcutaneous tissues anterior to the frontal bone, unchanged.  CT CERVICAL SPINE FINDINGS  No fracture or malalignment of the cervical spine is identified. Multilevel degenerative change is noted. Lung apices are clear.  IMPRESSION: No acute finding head or cervical spine.   Electronically Signed   By: Inge Rise M.D.   On: 03/08/2014 02:51   Ct Cervical Spine Wo Contrast  03/08/2014   CLINICAL DATA:  Status post fall.  EXAM: CT HEAD WITHOUT CONTRAST  CT CERVICAL SPINE WITHOUT CONTRAST  TECHNIQUE: Multidetector CT imaging of the head and cervical spine was performed following the standard protocol without intravenous contrast. Multiplanar CT image reconstructions of the cervical spine were also generated.  COMPARISON:  Head CT scan 08/26/2010.  FINDINGS: CT HEAD FINDINGS  There is cortical atrophy and chronic microvascular ischemic change. No evidence of acute abnormality including infarction, hemorrhage, mass lesion, mass effect, midline shift or abnormal extra-axial fluid collection is identified. There is a small radiopaque foreign body in the subcutaneous tissues anterior to the frontal bone, unchanged.  CT CERVICAL SPINE FINDINGS  No fracture or malalignment of the cervical spine is identified. Multilevel degenerative change is noted. Lung apices are clear.  IMPRESSION: No acute finding head or cervical spine.    Electronically Signed   By: Inge Rise M.D.   On: 03/08/2014 02:51    Positive ROS: All other systems have been reviewed and were otherwise negative with the exception of those mentioned in the HPI and as above.  Physical Exam: General: NAD Cardiovascular: No pedal edema Respiratory: No cyanosis, no use of accessory musculature GI: No organomegaly, abdomen is soft and non-tender Skin: No lesions in the area of chief complaint Neurologic: Sensation intact distally Psychiatric: Patient is slightly confused 2/2 pain meds Lymphatic: No axillary or cervical lymphadenopathy  MUSCULOSKELETAL:  LLE - skin intact - NVI - thin leg - limited ROM 2/2 pain  Assessment: Left IT hip fracture  Plan: - NPO - will try to do surgery today but if unable due to scheduling, may need  to wait until tomorrow - patient's wife to decide if she prefers someone older to perform surgery or not, daughter in agreement to go ahead with surgery - discussed r/b/a to surgery  - Based on history and fracture pattern this likely represents a fragility fracture. - Fragility fractures affect up to one half of women and one third of men after age 52 years and occur in the setting of bone disorder such as osteoporosis or osteopenia and warrant appropriate work-up. - The following are general recommendations that may serve as an outline for an appropriate work-up:  1.) Obtain bone density measurement to confirm presumptive diagnosis, assess severity of osteoporosis and risk of future fracture, and use as baseline for monitoring treatment  2.) Obtain laboratory tests: CBC, ESR, serum calcium, creatinine, albumin,phosphate, alkaline phosphatase, liver transaminases, protein electrophoresis, urinalysis, 25-hydroxyvitamin D.  3.) Exclude secondary causes of low bone mass and skeletal fragility (eg,multiple myeloma, lymphoma) as indicated.  4.) Obtain radiograph of thoracic and lumbar spine, particularly among  individuals with back pain or height loss to assess presence of vertebral fractures  5.) Intermittent administration of recombinant human parathyroid hormone  6.) Optimize nutritional status using nutritional supplementation.  7.) Patient/family education to prevent future falls.  8.) Early mobilization and exercise program - exercise decreases the rate of bone loss and has been associated with decreased rate of fragility fractures   Thank you for the consult and the opportunity to see [name]. Eduard Roux, MD [place] 8:10 AM

## 2014-03-08 NOTE — Progress Notes (Signed)
Utilization review completed.  

## 2014-03-08 NOTE — Anesthesia Preprocedure Evaluation (Addendum)
Anesthesia Evaluation  Patient identified by MRN, date of birth, ID band Patient awake    Reviewed: Allergy & Precautions, H&P , NPO status , Patient's Chart, lab work & pertinent test results  Airway       Dental   Pulmonary former smoker,          Cardiovascular hypertension, + Peripheral Vascular Disease + dysrhythmias Atrial Fibrillation     Neuro/Psych  Neuromuscular disease    GI/Hepatic PUD, GERD-  ,  Endo/Other    Renal/GU Renal disease     Musculoskeletal   Abdominal   Peds  Hematology  (+) anemia ,   Anesthesia Other Findings   Reproductive/Obstetrics                          Anesthesia Physical Anesthesia Plan  ASA: III  Anesthesia Plan: General   Post-op Pain Management:    Induction: Intravenous  Airway Management Planned: Oral ETT  Additional Equipment:   Intra-op Plan:   Post-operative Plan: Extubation in OR  Informed Consent: I have reviewed the patients History and Physical, chart, labs and discussed the procedure including the risks, benefits and alternatives for the proposed anesthesia with the patient or authorized representative who has indicated his/her understanding and acceptance.     Plan Discussed with:   Anesthesia Plan Comments:         Anesthesia Quick Evaluation

## 2014-03-08 NOTE — H&P (Signed)
[facility] History and Physical  Patient: [name]  ZOX:[id number]  DOB: [birth year]  DOS: the patient was seen and examined on 03/07/2014 PCP: Willie Otto, MD  Chief Complaint: Fall  HPI: [name] is a 80 y.o. male with Past medical history of hypertension, atrial fibrillation, GI bleed, carotid artery stenosis status post endarterectomy, 3.8 cm AAA, dyslipidemia, chronic kidney disease. The patient is coming from home. The patient was brought in by his daughter who found the patient on the floor in the bathroom. The patient was sitting on a stool next of the bathroom mirror, and possibly has gone for a bowel movement at which time he fell down and was found on the ground. There was no incontinence noted patient was awake and alert patient was complaining of pain in his left hip and therefore he was brought to the hospital. The patient hit his back of his head and left shoulder. The daughter and the patient denied any prior fever, chills, headache, cough, chest pain, palpitation, shortness of breath, orthopnea, PND, nausea, vomiting, abdominal pain, diarrhea, constipation, active bleeding, burning urination, dizziness, pedal edema,  focal neurological deficit.  He has chronic back pain. He has history of GI bleed and therefore he is not on any antiplatelet or anticoagulation. He has history of peripheral vascular disease and atrial fibrillation. He has 3.7 cm AAA last noted in 2014.  Review of Systems: as mentioned in the history of present illness.  A Comprehensive review of the other systems is negative.  Past Medical History  Diagnosis Date  . HLD (hyperlipidemia)   . HTN (hypertension)   . Atrial fibrillation   . Arthritis   . GERD (gastroesophageal reflux disease)   . Diverticulitis of colon with bleeding   . Carotid artery stenosis   . Colon polyps   . Kidney stones    Past Surgical History  Procedure Laterality Date  . Fetal surgery for congenital  hernia    . Appendectomy    . Cataract extraction      bilateral  . Shoulder surgery    . Pacemaker insertion    . Carotid endarterectomy    . Kidney stone surgery      removal on right side  . Colonoscopy  2007  . Hernia repair     Social History:  reports that he quit smoking about 44 years ago. His smoking use included Cigarettes. He smoked 0.00 packs per day. He has never used smokeless tobacco. He reports that he does not drink alcohol or use illicit drugs. Independent for most of his  ADL.  Allergies  Allergen Reactions  . Penicillins Anaphylaxis  . Streptomycin Anaphylaxis  . Lactose Intolerance (Gi)     Family History  Problem Relation Age of Onset  . Diabetes    . Coronary artery disease    . Stroke      Prior to Admission medications   Medication Sig Start Date End Date Taking? Authorizing Provider  acetaminophen (TYLENOL) 325 MG tablet Take 650 mg by mouth every 6 (six) hours as needed.     Yes Historical Provider, MD  amiodarone (PACERONE) 200 MG tablet Take 100 mg by mouth 2 (two) times daily.    Yes Historical Provider, MD  atenolol (TENORMIN) 50 MG tablet Take 25 mg by mouth daily. Half tab daily 10/31/12  Yes Marinus Maw, MD  cholestyramine Lanetta Inch) 4 GM/DOSE powder Take 2 g by mouth daily.   Yes Historical Provider, MD  Cranberry 425 MG  CAPS Take 425 mg by mouth daily.   Yes Historical Provider, MD  gabapentin (NEURONTIN) 300 MG capsule Take 300 mg by mouth 2 (two) times daily.    Yes Historical Provider, MD  Melatonin 3 MG CAPS Take 3 mg by mouth daily.   Yes Historical Provider, MD  omeprazole (PRILOSEC) 20 MG capsule Take 20 mg by mouth daily.    Yes Historical Provider, MD  saccharomyces boulardii (FLORASTOR) 250 MG capsule Take 250 mg by mouth 3 (three) times daily.   Yes Historical Provider, MD  traMADol (ULTRAM) 50 MG tablet Take by mouth every 6 (six) hours as needed.   Yes Historical Provider, MD    Physical Exam: Filed Vitals:   03/07/14  2200 03/07/14 2215 03/07/14 2230 03/07/14 2348  BP: 151/64 159/67 140/64 128/60  Pulse: 61 58 59 60  Temp:      TempSrc:      Resp:  17 17   Height:      Weight:      SpO2: 100% 96% 99% 100%    General: Alert, Awake and Oriented to Time, Place and Person. Appear in mild distress Eyes: PERRL ENT: Oral Mucosa cleardry. Neck: No JVD Cardiovascular: S1 and S2 Present, no Murmur, Peripheral Pulses Present Respiratory: Bilateral Air entry equal and Decreased, no  Crackles,no  wheezes Abdomen: Bowel Sound Present, Soft and Non tender Skin:  no  Rash Extremities:  no  Pedal edema,  no calf tenderness, pulses palpable Neurologic: Grossly Unremarkable. Labs on Admission:  CBC:  Recent Labs Lab 03/07/14 2148  WBC 8.4  NEUTROABS 5.9  HGB 12.0*  HCT 35.4*  MCV 101.4*  PLT 134*    CMP     Component Value Date/Time   NA 137 03/07/2014 2148   K 4.7 03/07/2014 2148   CL 105 03/07/2014 2148   CO2 21 03/07/2014 2148   GLUCOSE 121* 03/07/2014 2148   BUN 21 03/07/2014 2148   CREATININE 1.69* 03/07/2014 2148   CALCIUM 8.9 03/07/2014 2148   PROT 5.8* 09/30/2009 2109   ALBUMIN 3.4* 09/30/2009 2109   AST 24 09/30/2009 2109   ALT 24 09/30/2009 2109   ALKPHOS 70 09/30/2009 2109   BILITOT 0.4 09/30/2009 2109   GFRNONAA 35* 03/07/2014 2148   GFRAA 40* 03/07/2014 2148    No results found for this basename: LIPASE, AMYLASE,  in the last 168 hours No results found for this basename: AMMONIA,  in the last 168 hours  No results found for this basename: CKTOTAL, CKMB, CKMBINDEX, TROPONINI,  in the last 168 hours BNP (last 3 results) No results found for this basename: PROBNP,  in the last 8760 hours  Radiological Exams on Admission: Dg Chest 1 View  03/07/2014   CLINICAL DATA:  Status post fall.  Left hip fracture.  EXAM: CHEST - 1 VIEW  COMPARISON:  PA and lateral chest 09/24/2012.  FINDINGS: Lung volumes are low but the lungs are clear. Heart size is normal. Pacing device is in place. No pneumothorax  or pleural effusion. Multiple thoracic compression fracture are identified with postoperative change of vertebral augmentation noted.  IMPRESSION: No acute disease.   Electronically Signed   By: Drusilla Kannerhomas  Dalessio M.D.   On: 03/07/2014 23:06   Dg Hip Complete Left  03/07/2014   CLINICAL DATA:  Status post fall.  Left hip pain.  EXAM: LEFT HIP - COMPLETE 2+ VIEW  COMPARISON:  None.  FINDINGS: There is an acute left intertrochanteric fracture. No other acute bony or joint  abnormality is identified. Bones are osteopenic. The patient is status post vertebral augmentation lower lumbar spine.  IMPRESSION: Acute left intertrochanteric fracture.   Electronically Signed   By: Drusilla Kanner M.D.   On: 03/07/2014 23:05    EKG: Independently reviewed. normal EKG, normal sinus rhythm, nonspecific ST and T waves changes.  Assessment/Plan Principal Problem:   Hip fracture Active Problems:   NEUROPATHY   Atrial fibrillation   CAROTID ARTERY STENOSIS   ABDOMINAL AORTIC ANEURYSM   PPM-[employer]   1. Hip fracture the patient was brought in after a mechanical fall as per the family. He is found to have acute left intertrochanteric fracture of femur. Initial lab work does not show any acute abnormality his examination does not have any focal neurological deficit. EKG appears normal sinus rhythm. Chest x-ray is  Clear. He has chronic kidney disease and anemia, he also has atherosclerotic disease and is not on any antiplatelets due to GI bleed, he has evidence of malnutrition with poor appetite. Orthopedics has been consulted for surgery, patient will be kept n.p.o.,  Patient will be kept on IV fentanyl since he is sensitive to pain medications. Gentle IV hydration. Repeat labs in the morning.  2. neuropathy  Continue gabapentin and tramadol   3.atrial fibrillation Continue the amiodarone, at present and normal sinus rhythm Not on any anticoagulation due to GI bleed  4. carotid artery stenosis , AAA    continue to monitor, not on any antiplatelets due to GI bleed. Continue atenolol   5.chronic diarrhea  Continue Cholestyramine  Consults:  orthopedics   DVT Prophylaxis: subcutaneous Heparin Nutrition:  n.p.o.   Code Status:  DNR/DNI and   Family Communication:  Daughter was present at bedside, opportunity was given to ask question and all questions were answered satisfactorily at the time of interview. Disposition: Admitted to inpatient in telemetry unit.  Author: Lynden Oxford, MD [facility] Pager: [phone] 03/08/2014, 12:00 AM    If 7PM-7AM, please contact night-coverage [email or web address] Password [email or web address]

## 2014-03-09 ENCOUNTER — Encounter (HOSPITAL_COMMUNITY)
Admission: EM | Disposition: A | Discharge status: Skilled Nursing Facility | Payer: Self-pay | Source: Home / Self Care | Attending: Internal Medicine

## 2014-03-09 ENCOUNTER — Inpatient Hospital Stay (HOSPITAL_COMMUNITY): Payer: Medicare Other

## 2014-03-09 ENCOUNTER — Inpatient Hospital Stay (HOSPITAL_COMMUNITY): Payer: Medicare Other | Admitting: Anesthesiology

## 2014-03-09 ENCOUNTER — Encounter (HOSPITAL_COMMUNITY): Payer: Medicare Other | Admitting: Anesthesiology

## 2014-03-09 ENCOUNTER — Encounter (HOSPITAL_COMMUNITY): Payer: Self-pay | Admitting: Certified Registered Nurse Anesthetist

## 2014-03-09 DIAGNOSIS — E43 Unspecified severe protein-calorie malnutrition: Secondary | ICD-10-CM | POA: Diagnosis present

## 2014-03-09 HISTORY — PX: INTRAMEDULLARY (IM) NAIL INTERTROCHANTERIC: SHX5875

## 2014-03-09 LAB — URINALYSIS, ROUTINE W REFLEX MICROSCOPIC
BILIRUBIN URINE: NEGATIVE
GLUCOSE, UA: NEGATIVE mg/dL
HGB URINE DIPSTICK: NEGATIVE
KETONES UR: NEGATIVE mg/dL
Leukocytes, UA: NEGATIVE
Nitrite: NEGATIVE
PH: 5.5 (ref 5.0–8.0)
Protein, ur: NEGATIVE mg/dL
Specific Gravity, Urine: 1.019 (ref 1.005–1.030)
Urobilinogen, UA: 0.2 mg/dL (ref 0.0–1.0)

## 2014-03-09 LAB — BASIC METABOLIC PANEL
BUN: 23 mg/dL (ref 6–23)
CHLORIDE: 105 meq/L (ref 96–112)
CO2: 20 mEq/L (ref 19–32)
Calcium: 8.3 mg/dL — ABNORMAL LOW (ref 8.4–10.5)
Creatinine, Ser: 1.5 mg/dL — ABNORMAL HIGH (ref 0.50–1.35)
GFR calc Af Amer: 46 mL/min — ABNORMAL LOW (ref >90)
GFR calc non Af Amer: 40 mL/min — ABNORMAL LOW (ref >90)
GLUCOSE: 122 mg/dL — AB (ref 70–99)
POTASSIUM: 5.5 meq/L — AB (ref 3.7–5.3)
SODIUM: 136 meq/L — AB (ref 137–147)

## 2014-03-09 LAB — CBC
HCT: 19.8 % — ABNORMAL LOW (ref 39.0–52.0)
HEMOGLOBIN: 6.6 g/dL — AB (ref 13.0–17.0)
MCH: 34.2 pg — ABNORMAL HIGH (ref 26.0–34.0)
MCHC: 33.3 g/dL (ref 30.0–36.0)
MCV: 102.6 fL — ABNORMAL HIGH (ref 78.0–100.0)
Platelets: 87 10*3/uL — ABNORMAL LOW (ref 150–400)
RBC: 1.93 MIL/uL — ABNORMAL LOW (ref 4.22–5.81)
RDW: 13.7 % (ref 11.5–15.5)
WBC: 14.9 10*3/uL — AB (ref 4.0–10.5)

## 2014-03-09 LAB — PREPARE RBC (CROSSMATCH)

## 2014-03-09 SURGERY — FIXATION, FRACTURE, INTERTROCHANTERIC, WITH INTRAMEDULLARY ROD
Anesthesia: General | Laterality: Left

## 2014-03-09 MED ORDER — CLINDAMYCIN PHOSPHATE 600 MG/50ML IV SOLN
600.0000 mg | Freq: Four times a day (QID) | INTRAVENOUS | Status: AC
  Administered 2014-03-09: 600 mg via INTRAVENOUS
  Filled 2014-03-09 (×2): qty 50

## 2014-03-09 MED ORDER — PHENYLEPHRINE HCL 10 MG/ML IJ SOLN
INTRAMUSCULAR | Status: DC | PRN
  Administered 2014-03-09 (×4): 120 ug via INTRAVENOUS

## 2014-03-09 MED ORDER — PROPOFOL 10 MG/ML IV BOLUS
INTRAVENOUS | Status: DC | PRN
  Administered 2014-03-09: 80 mg via INTRAVENOUS

## 2014-03-09 MED ORDER — OXYCODONE HCL 5 MG PO TABS: 5.0000 mg | ORAL_TABLET | ORAL | Status: DC | PRN

## 2014-03-09 MED ORDER — PROPOFOL 10 MG/ML IV BOLUS
INTRAVENOUS | Status: AC
  Filled 2014-03-09: qty 20

## 2014-03-09 MED ORDER — ONDANSETRON HCL 4 MG PO TABS: 4.0000 mg | ORAL_TABLET | Freq: Four times a day (QID) | ORAL | Status: DC | PRN

## 2014-03-09 MED ORDER — GLYCOPYRROLATE 0.2 MG/ML IJ SOLN
INTRAMUSCULAR | Status: DC | PRN
  Administered 2014-03-09: .4 mg via INTRAVENOUS

## 2014-03-09 MED ORDER — MENTHOL 3 MG MT LOZG: 1.0000 | LOZENGE | OROMUCOSAL | Status: DC | PRN

## 2014-03-09 MED ORDER — NEOSTIGMINE METHYLSULFATE 1 MG/ML IJ SOLN
INTRAMUSCULAR | Status: AC
  Filled 2014-03-09: qty 10

## 2014-03-09 MED ORDER — ROCURONIUM BROMIDE 100 MG/10ML IV SOLN
INTRAVENOUS | Status: DC | PRN
  Administered 2014-03-09: 30 mg via INTRAVENOUS

## 2014-03-09 MED ORDER — SORBITOL 70 % SOLN: 30.0000 mL | Freq: Every day | Status: DC | PRN

## 2014-03-09 MED ORDER — METOCLOPRAMIDE HCL 10 MG PO TABS: 5.0000 mg | ORAL_TABLET | Freq: Three times a day (TID) | ORAL | Status: DC | PRN

## 2014-03-09 MED ORDER — ENOXAPARIN SODIUM 30 MG/0.3ML ~~LOC~~ SOLN: 30.0000 mg | Freq: Two times a day (BID) | SUBCUTANEOUS | Status: DC

## 2014-03-09 MED ORDER — ROCURONIUM BROMIDE 50 MG/5ML IV SOLN
INTRAVENOUS | Status: AC
  Filled 2014-03-09: qty 1

## 2014-03-09 MED ORDER — ONDANSETRON HCL 4 MG/2ML IJ SOLN: 4.0000 mg | Freq: Four times a day (QID) | INTRAMUSCULAR | Status: DC | PRN

## 2014-03-09 MED ORDER — HYDROCODONE-ACETAMINOPHEN 5-325 MG PO TABS: 1.0000 | ORAL_TABLET | Freq: Four times a day (QID) | ORAL | Status: DC | PRN

## 2014-03-09 MED ORDER — SODIUM CHLORIDE 0.9 % IV SOLN
INTRAVENOUS | Status: DC
  Administered 2014-03-09 – 2014-03-10 (×2): via INTRAVENOUS

## 2014-03-09 MED ORDER — ARTIFICIAL TEARS OP OINT
TOPICAL_OINTMENT | OPHTHALMIC | Status: AC
  Filled 2014-03-09: qty 3.5

## 2014-03-09 MED ORDER — LACTATED RINGERS IV SOLN
INTRAVENOUS | Status: DC | PRN
  Administered 2014-03-09: 07:00:00 via INTRAVENOUS

## 2014-03-09 MED ORDER — LORAZEPAM 2 MG/ML IJ SOLN
INTRAMUSCULAR | Status: AC
  Filled 2014-03-09: qty 1

## 2014-03-09 MED ORDER — 0.9 % SODIUM CHLORIDE (POUR BTL) OPTIME
TOPICAL | Status: DC | PRN
  Administered 2014-03-09: 1000 mL

## 2014-03-09 MED ORDER — PHENYLEPHRINE HCL 10 MG/ML IJ SOLN
10.0000 mg | INTRAVENOUS | Status: DC | PRN
  Administered 2014-03-09: 25 ug/min via INTRAVENOUS

## 2014-03-09 MED ORDER — MAGNESIUM CITRATE PO SOLN: 1.0000 | Freq: Once | ORAL | Status: AC | PRN

## 2014-03-09 MED ORDER — SENNA 8.6 MG PO TABS
1.0000 | ORAL_TABLET | Freq: Two times a day (BID) | ORAL | Status: DC
  Administered 2014-03-09 – 2014-03-12 (×6): 8.6 mg via ORAL
  Filled 2014-03-09 (×9): qty 1

## 2014-03-09 MED ORDER — FENTANYL CITRATE 0.05 MG/ML IJ SOLN
INTRAMUSCULAR | Status: AC
  Filled 2014-03-09: qty 5

## 2014-03-09 MED ORDER — PHENOL 1.4 % MT LIQD
1.0000 | OROMUCOSAL | Status: DC | PRN
  Administered 2014-03-10: 1 via OROMUCOSAL
  Filled 2014-03-09: qty 177

## 2014-03-09 MED ORDER — ONDANSETRON HCL 4 MG/2ML IJ SOLN: 4.0000 mg | Freq: Once | INTRAMUSCULAR | Status: DC | PRN

## 2014-03-09 MED ORDER — METOCLOPRAMIDE HCL 5 MG/ML IJ SOLN: 5.0000 mg | Freq: Three times a day (TID) | INTRAMUSCULAR | Status: DC | PRN

## 2014-03-09 MED ORDER — POLYETHYLENE GLYCOL 3350 17 G PO PACK: 17.0000 g | PACK | Freq: Every day | ORAL | Status: DC | PRN

## 2014-03-09 MED ORDER — ACETAMINOPHEN 325 MG PO TABS
650.0000 mg | ORAL_TABLET | Freq: Four times a day (QID) | ORAL | Status: DC | PRN
  Administered 2014-03-10: 650 mg via ORAL
  Administered 2014-03-11: 325 mg via ORAL
  Administered 2014-03-11: 650 mg via ORAL
  Administered 2014-03-12 (×2): 325 mg via ORAL
  Filled 2014-03-09 (×4): qty 2

## 2014-03-09 MED ORDER — HYDROMORPHONE HCL PF 1 MG/ML IJ SOLN: 0.2500 mg | INTRAMUSCULAR | Status: DC | PRN

## 2014-03-09 MED ORDER — LIDOCAINE HCL (CARDIAC) 20 MG/ML IV SOLN
INTRAVENOUS | Status: DC | PRN
  Administered 2014-03-09: 100 mg via INTRAVENOUS

## 2014-03-09 MED ORDER — FENTANYL CITRATE 0.05 MG/ML IJ SOLN
INTRAMUSCULAR | Status: DC | PRN
  Administered 2014-03-09: 25 ug via INTRAVENOUS
  Administered 2014-03-09: 150 ug via INTRAVENOUS
  Administered 2014-03-09: 25 ug via INTRAVENOUS

## 2014-03-09 MED ORDER — MIDAZOLAM HCL 2 MG/2ML IJ SOLN
INTRAMUSCULAR | Status: AC
  Filled 2014-03-09: qty 2

## 2014-03-09 MED ORDER — ACETAMINOPHEN 650 MG RE SUPP: 650.0000 mg | Freq: Four times a day (QID) | RECTAL | Status: DC | PRN

## 2014-03-09 MED ORDER — LIDOCAINE HCL (CARDIAC) 20 MG/ML IV SOLN
INTRAVENOUS | Status: AC
  Filled 2014-03-09: qty 5

## 2014-03-09 MED ORDER — LORAZEPAM 2 MG/ML IJ SOLN
0.5000 mg | Freq: Once | INTRAMUSCULAR | Status: AC
  Administered 2014-03-09: 0.5 mg via INTRAVENOUS
  Filled 2014-03-09: qty 1

## 2014-03-09 MED ORDER — NEOSTIGMINE METHYLSULFATE 1 MG/ML IJ SOLN
INTRAMUSCULAR | Status: DC | PRN
  Administered 2014-03-09: 3 mg via INTRAVENOUS

## 2014-03-09 MED ORDER — METHOCARBAMOL 500 MG PO TABS: 500.0000 mg | ORAL_TABLET | Freq: Four times a day (QID) | ORAL | Status: DC | PRN

## 2014-03-09 MED ORDER — ALBUMIN HUMAN 5 % IV SOLN
INTRAVENOUS | Status: DC | PRN
  Administered 2014-03-09 (×2): via INTRAVENOUS

## 2014-03-09 MED ORDER — MORPHINE SULFATE 2 MG/ML IJ SOLN: 0.5000 mg | INTRAMUSCULAR | Status: DC | PRN

## 2014-03-09 MED ORDER — DEXTROSE 5 % IV SOLN
500.0000 mg | Freq: Four times a day (QID) | INTRAVENOUS | Status: DC | PRN
  Filled 2014-03-09: qty 5

## 2014-03-09 MED ORDER — GLYCOPYRROLATE 0.2 MG/ML IJ SOLN
INTRAMUSCULAR | Status: AC
  Filled 2014-03-09: qty 2

## 2014-03-09 MED ORDER — ONDANSETRON HCL 4 MG/2ML IJ SOLN
INTRAMUSCULAR | Status: AC
  Filled 2014-03-09: qty 2

## 2014-03-09 MED ORDER — FUROSEMIDE 10 MG/ML IJ SOLN
20.0000 mg | Freq: Once | INTRAMUSCULAR | Status: AC
  Administered 2014-03-09: 20 mg via INTRAVENOUS
  Filled 2014-03-09: qty 2

## 2014-03-09 MED ORDER — ENOXAPARIN SODIUM 30 MG/0.3ML ~~LOC~~ SOLN
30.0000 mg | SUBCUTANEOUS | Status: DC
  Administered 2014-03-10 – 2014-03-11 (×2): 30 mg via SUBCUTANEOUS
  Filled 2014-03-09 (×3): qty 0.3

## 2014-03-09 MED ORDER — ONDANSETRON HCL 4 MG/2ML IJ SOLN
INTRAMUSCULAR | Status: DC | PRN
  Administered 2014-03-09: 4 mg via INTRAVENOUS

## 2014-03-09 MED ORDER — ALUM & MAG HYDROXIDE-SIMETH 200-200-20 MG/5ML PO SUSP: 30.0000 mL | ORAL | Status: DC | PRN

## 2014-03-09 SURGICAL SUPPLY — 43 items
BANDAGE GAUZE ELAST BULKY 4 IN (GAUZE/BANDAGES/DRESSINGS) ×3 IMPLANT
BLADE SURG 15 STRL LF DISP TIS (BLADE) ×1 IMPLANT
BLADE SURG 15 STRL SS (BLADE) ×3
BNDG COHESIVE 4X5 TAN NS LF (GAUZE/BANDAGES/DRESSINGS) ×3 IMPLANT
COVER SURGICAL LIGHT HANDLE (MISCELLANEOUS) ×3 IMPLANT
DRAPE PROXIMA HALF (DRAPES) ×2 IMPLANT
DRAPE STERI IOBAN 125X83 (DRAPES) ×3 IMPLANT
DRSG MEPILEX BORDER 4X4 (GAUZE/BANDAGES/DRESSINGS) ×2 IMPLANT
DRSG MEPILEX BORDER 4X8 (GAUZE/BANDAGES/DRESSINGS) ×3 IMPLANT
DRSG PAD ABDOMINAL 8X10 ST (GAUZE/BANDAGES/DRESSINGS) ×6 IMPLANT
DURAPREP 26ML APPLICATOR (WOUND CARE) ×3 IMPLANT
ELECT CAUTERY BLADE 6.4 (BLADE) ×3 IMPLANT
ELECT REM PT RETURN 9FT ADLT (ELECTROSURGICAL) ×3
ELECTRODE REM PT RTRN 9FT ADLT (ELECTROSURGICAL) ×1 IMPLANT
FACESHIELD LNG OPTICON STERILE (SAFETY) ×3 IMPLANT
GAUZE XEROFORM 5X9 LF (GAUZE/BANDAGES/DRESSINGS) ×3 IMPLANT
GLOVE BIOGEL PI IND STRL 8 (GLOVE) IMPLANT
GLOVE BIOGEL PI INDICATOR 8 (GLOVE) ×2
GLOVE SURG SS PI 7.5 STRL IVOR (GLOVE) ×6 IMPLANT
GOWN STRL REUS W/ TWL LRG LVL3 (GOWN DISPOSABLE) ×1 IMPLANT
GOWN STRL REUS W/ TWL XL LVL3 (GOWN DISPOSABLE) ×1 IMPLANT
GOWN STRL REUS W/TWL LRG LVL3 (GOWN DISPOSABLE) ×3
GOWN STRL REUS W/TWL XL LVL3 (GOWN DISPOSABLE) ×3
GUIDE PIN 3.2MM (MISCELLANEOUS) ×3
GUIDE PIN ORTH 343X3.2XBRAD (MISCELLANEOUS) IMPLANT
KIT BASIN OR (CUSTOM PROCEDURE TRAY) ×3 IMPLANT
KIT ROOM TURNOVER OR (KITS) ×3 IMPLANT
MANIFOLD NEPTUNE II (INSTRUMENTS) ×3 IMPLANT
NAIL TRIGEN INTERTAN 10X40-125 (Nail) ×2 IMPLANT
NS IRRIG 1000ML POUR BTL (IV SOLUTION) ×3 IMPLANT
PACK GENERAL/GYN (CUSTOM PROCEDURE TRAY) ×3 IMPLANT
PAD ARMBOARD 7.5X6 YLW CONV (MISCELLANEOUS) ×6 IMPLANT
PAD CAST 4YDX4 CTTN HI CHSV (CAST SUPPLIES) ×2 IMPLANT
PADDING CAST COTTON 4X4 STRL (CAST SUPPLIES) ×6
SCREW LAG COMPR KIT 100/95 (Screw) ×2 IMPLANT
STAPLER VISISTAT 35W (STAPLE) ×3 IMPLANT
SUT VIC AB 0 CT1 27 (SUTURE) ×3
SUT VIC AB 0 CT1 27XBRD ANBCTR (SUTURE) ×2 IMPLANT
SUT VIC AB 2-0 CT1 27 (SUTURE) ×3
SUT VIC AB 2-0 CT1 TAPERPNT 27 (SUTURE) ×2 IMPLANT
TOWEL OR 17X24 6PK STRL BLUE (TOWEL DISPOSABLE) ×3 IMPLANT
TOWEL OR 17X26 10 PK STRL BLUE (TOWEL DISPOSABLE) ×3 IMPLANT
WATER STERILE IRR 1000ML POUR (IV SOLUTION) ×3 IMPLANT

## 2014-03-09 NOTE — Progress Notes (Signed)
ANTICOAGULATION CONSULT NOTE - Initial Consult  Pharmacy Consult for lovenox Indication: VTE prophylaxis  Allergies  Allergen Reactions  . Penicillins Anaphylaxis  . Streptomycin Anaphylaxis  . Lactose Intolerance (Gi)     Patient Measurements: Height: 5\' 10"  (177.8 cm) Weight: 116 lb 10 oz (52.9 kg) IBW/kg (Calculated) : 73   Vital Signs: Temp: 98.5 F (36.9 C) (03/17 1215) Temp src: Oral (03/17 0526) BP: 111/73 mmHg (03/17 1215) Pulse Rate: 60 (03/17 1215)  Labs:  Recent Labs  03/07/14 2148 03/08/14 0509 03/09/14 1142  HGB 12.0* 11.0* 6.6*  HCT 35.4* 33.4* 19.8*  PLT 134* 122* PENDING  LABPROT 13.3  --   --   INR 1.03  --   --   CREATININE 1.69* 1.64* 1.50*    Estimated Creatinine Clearance: 26 ml/min (by C-G formula based on Cr of 1.5).   Medical History: Past Medical History  Diagnosis Date  . HLD (hyperlipidemia)   . HTN (hypertension)   . Atrial fibrillation   . Arthritis   . GERD (gastroesophageal reflux disease)   . Diverticulitis of colon with bleeding   . Carotid artery stenosis   . Colon polyps   . Kidney stones     Medications:  Scheduled:  . amiodarone  100 mg Oral BID  . atenolol  25 mg Oral Daily  . cholestyramine  2 g Oral Daily  . clindamycin (CLEOCIN) IV  600 mg Intravenous Q6H  . [START ON 03/10/2014] enoxaparin (LOVENOX) injection  30 mg Subcutaneous Q24H  . furosemide  20 mg Intravenous Once  . gabapentin  300 mg Oral BID  . heparin  5,000 Units Subcutaneous 3 times per day  . pantoprazole  40 mg Oral Daily  . senna  1 tablet Oral BID    Assessment: 80 yo man to start lovenox for VTE px.  CrCl ~ 26 ml/min and weight 52.9 kg Goal of Therapy:  Monitor platelets by anticoagulation protocol: Yes   Plan:  Lovenox 30 mg sq daily Pharmacy will sign off. Please advise if we can be of further assistance  Talbert CageSeay, Agam Davenport [place] 03/09/2014,12:46 PM

## 2014-03-09 NOTE — Preoperative (Signed)
Beta Blockers   Reason not to administer Beta Blockers:Not Applicable 

## 2014-03-09 NOTE — Progress Notes (Signed)
Pt currently agitated, pulling lines and removing surgical dressing.  Alternative measures such as environmental changes, repositioning and patient safety mittens tried.  Per daughter request, on-call MD contacted.  Dr. Magnus IvanBlackman ordered one time dose of Ativan 0.5mg  IV for pt agitation.   VS stable.  Will continue to monitor patient closely.

## 2014-03-09 NOTE — Transfer of Care (Signed)
Immediate Anesthesia Transfer of Care Note  Patient: [name]  Procedure(s) Performed: Procedure(s): INTRAMEDULLARY (IM) NAIL INTERTROCHANTRIC LEFT HIP (Left)  Patient Location: PACU  Anesthesia Type:General  Level of Consciousness: awake, alert  and oriented  Airway & Oxygen Therapy: Patient Spontanous Breathing  Post-op Assessment: Report given to PACU RN  Post vital signs: Reviewed and stable  Complications: No apparent anesthesia complications

## 2014-03-09 NOTE — Discharge Instructions (Signed)
May get incisions wet after postoperative day 10

## 2014-03-09 NOTE — Progress Notes (Signed)
Critical value called from lab hgb 6.6 Dr Benjamine MolaVann notified to order tansfusion

## 2014-03-09 NOTE — Progress Notes (Signed)
PROGRESS NOTE  [name] EAV:[id number] DOB: [birth year] DOA: 03/07/2014 PCP: Ginette OttoSTONEKING,HAL THOMAS, MD  Assessment/Plan: Hip fracture  -acute left intertrochanteric fracture of femur.  -s/p surgery 3/17 -IV fentanyl  -Gentle IV hydration.  Repeat labs in the morning.   neuropathy  Continue gabapentin and tramadol   atrial fibrillation  Continue the amiodarone, at present and normal sinus rhythm  Not on any anticoagulation due to GI bleed   carotid artery stenosis , AAA  continue to monitor, not on any antiplatelets due to GI bleed.  Continue atenolol   chronic diarrhea  Continue Cholestyramine  Severe malnutrition -regular diet   Code Status: dnr Family Communication: daughter at bedside Disposition Plan: admit   Consultants:  ortho  Procedures:     HPI/Subjective: Asking for water Pain in hip  Objective: Filed Vitals:   03/09/14 1050  BP: 106/44  Pulse: 80  Temp: 98.4 F (36.9 C)  Resp: 16    Intake/Output Summary (Last 24 hours) at 03/09/14 1144 Last data filed at 03/09/14 1000  Gross per 24 hour  Intake    740 ml  Output    640 ml  Net    100 ml   Filed Weights   03/07/14 2129 03/08/14 0053  Weight: 50.803 kg (112 lb) 52.9 kg (116 lb 10 oz)    Exam:  General: chronically ill appearing, hard of hearing  Cardiovascular: S1 and S2 Present, no Murmur, Peripheral Pulses Present  Respiratory: Bilateral Air entry equal and Decreased, no Crackles,no wheezes  Abdomen: Bowel Sound Present, Soft and Non tender  Extremities: no Pedal edema, no calf tenderness, pulses palpable      Data Reviewed: Basic Metabolic Panel:  Recent Labs Lab 03/07/14 2148 03/08/14 0509  NA 137 137  K 4.7 4.9  CL 105 102  CO2 21 20  GLUCOSE 121* 180*  BUN 21 23  CREATININE 1.69* 1.64*  CALCIUM 8.9 8.9   Liver Function Tests: No results found for this basename: AST, ALT, ALKPHOS, BILITOT, PROT, ALBUMIN,  in the last 168 hours No results found for this  basename: LIPASE, AMYLASE,  in the last 168 hours No results found for this basename: AMMONIA,  in the last 168 hours CBC:  Recent Labs Lab 03/07/14 2148 03/08/14 0509  WBC 8.4 15.9*  NEUTROABS 5.9  --   HGB 12.0* 11.0*  HCT 35.4* 33.4*  MCV 101.4* 102.5*  PLT 134* 122*   Cardiac Enzymes: No results found for this basename: CKTOTAL, CKMB, CKMBINDEX, TROPONINI,  in the last 168 hours BNP (last 3 results) No results found for this basename: PROBNP,  in the last 8760 hours CBG: No results found for this basename: GLUCAP,  in the last 168 hours  Recent Results (from the past 240 hour(s))  SURGICAL PCR SCREEN     Status: None   Collection Time    03/08/14  2:57 AM      Result Value Ref Range Status   MRSA, PCR NEGATIVE  NEGATIVE Final   Staphylococcus aureus NEGATIVE  NEGATIVE Final   Comment:            The Xpert SA Assay (FDA     approved for NASAL specimens     in patients over 78 years of age),     is one component of     a comprehensive surveillance     program.  Test performance has     been validated by [facility] for patients greater  than or equal to 83 year old.     It is not intended     to diagnose infection nor to     guide or monitor treatment.     Studies: Dg Chest 1 View  03/07/2014   CLINICAL DATA:  Status post fall.  Left hip fracture.  EXAM: CHEST - 1 VIEW  COMPARISON:  PA and lateral chest 09/24/2012.  FINDINGS: Lung volumes are low but the lungs are clear. Heart size is normal. Pacing device is in place. No pneumothorax or pleural effusion. Multiple thoracic compression fracture are identified with postoperative change of vertebral augmentation noted.  IMPRESSION: No acute disease.   Electronically Signed   By: Drusilla Kanner M.D.   On: 03/07/2014 23:06   Dg Hip Complete Left  03/07/2014   CLINICAL DATA:  Status post fall.  Left hip pain.  EXAM: LEFT HIP - COMPLETE 2+ VIEW  COMPARISON:  None.  FINDINGS: There is an acute left  intertrochanteric fracture. No other acute bony or joint abnormality is identified. Bones are osteopenic. The patient is status post vertebral augmentation lower lumbar spine.  IMPRESSION: Acute left intertrochanteric fracture.   Electronically Signed   By: Drusilla Kanner M.D.   On: 03/07/2014 23:05   Dg Hip Operative Left  03/09/2014   CLINICAL DATA:  Left intertrochanteric nail insertion  EXAM: OPERATIVE LEFT HIP  FLUOROSCOPY TIME:  53 seconds  COMPARISON:  DG HIP COMPLETE*L* dated 03/07/2014  FINDINGS: Two spot intraoperative fluoroscopic images of the left hip are provided for review.  Images demonstrate the sequela of intramedullary rod fixation of the proximal femur as well as dynamic screw fixation of the left femoral neck. Note, the caudal aspect of the intra medullary femoral rod is excluded from view. There is persistent mild displacement of the lesser trochanter. Alignment otherwise appears markedly improved. There is expected subcutaneous emphysema about the operative site. No radiopaque foreign body.  IMPRESSION: Post ORIF of the left femur without evidence of complication.   Electronically Signed   By: Simonne Come M.D.   On: 03/09/2014 08:57   Ct Head Wo Contrast  03/08/2014   CLINICAL DATA:  Status post fall.  EXAM: CT HEAD WITHOUT CONTRAST  CT CERVICAL SPINE WITHOUT CONTRAST  TECHNIQUE: Multidetector CT imaging of the head and cervical spine was performed following the standard protocol without intravenous contrast. Multiplanar CT image reconstructions of the cervical spine were also generated.  COMPARISON:  Head CT scan 08/26/2010.  FINDINGS: CT HEAD FINDINGS  There is cortical atrophy and chronic microvascular ischemic change. No evidence of acute abnormality including infarction, hemorrhage, mass lesion, mass effect, midline shift or abnormal extra-axial fluid collection is identified. There is a small radiopaque foreign body in the subcutaneous tissues anterior to the frontal bone,  unchanged.  CT CERVICAL SPINE FINDINGS  No fracture or malalignment of the cervical spine is identified. Multilevel degenerative change is noted. Lung apices are clear.  IMPRESSION: No acute finding head or cervical spine.   Electronically Signed   By: Drusilla Kanner M.D.   On: 03/08/2014 02:51   Ct Cervical Spine Wo Contrast  03/08/2014   CLINICAL DATA:  Status post fall.  EXAM: CT HEAD WITHOUT CONTRAST  CT CERVICAL SPINE WITHOUT CONTRAST  TECHNIQUE: Multidetector CT imaging of the head and cervical spine was performed following the standard protocol without intravenous contrast. Multiplanar CT image reconstructions of the cervical spine were also generated.  COMPARISON:  Head CT scan 08/26/2010.  FINDINGS: CT HEAD FINDINGS  There is cortical atrophy and chronic microvascular ischemic change. No evidence of acute abnormality including infarction, hemorrhage, mass lesion, mass effect, midline shift or abnormal extra-axial fluid collection is identified. There is a small radiopaque foreign body in the subcutaneous tissues anterior to the frontal bone, unchanged.  CT CERVICAL SPINE FINDINGS  No fracture or malalignment of the cervical spine is identified. Multilevel degenerative change is noted. Lung apices are clear.  IMPRESSION: No acute finding head or cervical spine.   Electronically Signed   By: Drusilla Kanner M.D.   On: 03/08/2014 02:51    Scheduled Meds: . amiodarone  100 mg Oral BID  . atenolol  25 mg Oral Daily  . cholestyramine  2 g Oral Daily  . clindamycin (CLEOCIN) IV  600 mg Intravenous Q6H  . [START ON 03/10/2014] enoxaparin (LOVENOX) injection  30 mg Subcutaneous Q24H  . gabapentin  300 mg Oral BID  . heparin  5,000 Units Subcutaneous 3 times per day  . pantoprazole  40 mg Oral Daily  . senna  1 tablet Oral BID   Continuous Infusions: . sodium chloride    . sodium chloride     Antibiotics Given (last 72 hours)   Date/Time Action Medication Dose Rate   03/09/14 0537 Given    clindamycin (CLEOCIN) IVPB 900 mg 900 mg 100 mL/hr      Principal Problem:   Hip fracture Active Problems:   NEUROPATHY   Atrial fibrillation   CAROTID ARTERY STENOSIS   ABDOMINAL AORTIC ANEURYSM   PPM-[employer]   Protein-calorie malnutrition, severe    Time spent: 35 min    Jonathin Heinicke  [facility] Pager [phone]. If 7PM-7AM, please contact night-coverage at [email or web address], password [facility] 03/09/2014, 11:44 AM  LOS: 2 days

## 2014-03-09 NOTE — Op Note (Signed)
   Date of Surgery: 03/09/2014  INDICATIONS: [name] is a 80 y.o.-year-old male who was involved in an unwitnessed fall and sustained a left hip fracture (pertrochanteric-type). The risks and benefits of the procedure discussed with the family prior to the procedure and all questions were answered; consent was obtained.  PREOPERATIVE DIAGNOSIS: left hip fracture (intertrochanteric-type)   POSTOPERATIVE DIAGNOSIS: Same   PROCEDURE: Treatment of intertrochanteric fracture with intramedullary implant. CPT [phone]   SURGEON: N. Glee ArvinMichael Xu, M.D.   ANESTHESIA: general   IV FLUIDS AND URINE: See anesthesia record   ESTIMATED BLOOD LOSS: 200 cc  IMPLANTS: [employer] InterTAN 10 x 40  DRAINS: None.   COMPLICATIONS: None.   DESCRIPTION OF PROCEDURE: The patient was brought to the operating room and placed supine on the operating table. The patient's leg had been signed prior to the procedure. The patient had the anesthesia placed by the anesthesiologist. The prep verification and incision time-outs were performed to confirm that this was the correct patient, site, side and location. The patient had an SCD on the opposite lower extremity. The patient did receive antibiotics prior to the incision and was re-dosed during the procedure as needed at indicated intervals. The patient was positioned on the fracture table with the table in traction and internal rotation to reduce the hip. The well leg was placed in a hemi-lithotomy position and all bony prominences were well-padded. The patient had the lower extremity prepped and draped in the standard surgical fashion. The incision was made 4 finger breadths superior to the greater trochanter. A guide pin was inserted into the tip of the greater trochanter under fluoroscopic guidance. An opening reamer was used to gain access to the femoral canal. The nail length was measured and inserted down the femoral canal to its proper depth. The appropriate  version of insertion for the lag screw was found under fluoroscopy. A pin was inserted up the femoral neck through the jig. Then, a second antirotation pin was inserted inferior to the first pin. The length of the lag screw was then measured. The lag screw was inserted as near to center-center in the head as possible. The antirotation pin was then taken out and an interdigitating compression screw was placed in its place. The leg was taken out of traction, then the interdigitating compression screw was used to compress across the fracture. Compression was visualized on serial xrays. The wound was copiously irrigated with saline and the subcutaneous layer closed with 2.0 vicryl and the skin was reapproximated with staples. The wounds were cleaned and dried a final time and a sterile dressing was placed. The hip was taken through a range of motion at the end of the case under fluoroscopic imaging to visualize the approach-withdraw phenomenon and confirm implant length in the head. The patient was then awakened from anesthesia and taken to the recovery room in stable condition. All counts were correct at the end of the case.   POSTOPERATIVE PLAN: The patient will be weight bearing as tolerated and will return in 2 weeks for staple removal and the patient will receive DVT prophylaxis based on other medications, activity level, and risk ratio of bleeding to thrombosis.   [name]. Michael Xu, MD [facility] [phone] 8:46 AM

## 2014-03-10 ENCOUNTER — Telehealth: Payer: Self-pay | Admitting: Internal Medicine

## 2014-03-10 ENCOUNTER — Telehealth: Payer: Self-pay | Admitting: *Deleted

## 2014-03-10 LAB — CBC
HEMATOCRIT: 27.5 % — AB (ref 39.0–52.0)
Hemoglobin: 9.5 g/dL — ABNORMAL LOW (ref 13.0–17.0)
MCH: 31.4 pg (ref 26.0–34.0)
MCHC: 34.5 g/dL (ref 30.0–36.0)
MCV: 90.8 fL (ref 78.0–100.0)
PLATELETS: 80 10*3/uL — AB (ref 150–400)
RBC: 3.03 MIL/uL — ABNORMAL LOW (ref 4.22–5.81)
RDW: 19.5 % — ABNORMAL HIGH (ref 11.5–15.5)
WBC: 16.1 10*3/uL — AB (ref 4.0–10.5)

## 2014-03-10 LAB — TYPE AND SCREEN
ABO/RH(D): B NEG
Antibody Screen: NEGATIVE
Unit division: 0
Unit division: 0

## 2014-03-10 LAB — BASIC METABOLIC PANEL
BUN: 26 mg/dL — ABNORMAL HIGH (ref 6–23)
CO2: 21 mEq/L (ref 19–32)
Calcium: 8.5 mg/dL (ref 8.4–10.5)
Chloride: 104 mEq/L (ref 96–112)
Creatinine, Ser: 1.61 mg/dL — ABNORMAL HIGH (ref 0.50–1.35)
GFR, EST AFRICAN AMERICAN: 43 mL/min — AB (ref >90)
GFR, EST NON AFRICAN AMERICAN: 37 mL/min — AB (ref >90)
Glucose, Bld: 126 mg/dL — ABNORMAL HIGH (ref 70–99)
POTASSIUM: 4.4 meq/L (ref 3.7–5.3)
SODIUM: 139 meq/L (ref 137–147)

## 2014-03-10 MED ORDER — WHITE PETROLATUM GEL
Status: AC
  Administered 2014-03-10: 0.2
  Filled 2014-03-10: qty 5

## 2014-03-10 MED ORDER — HALOPERIDOL 0.5 MG PO TABS
0.5000 mg | ORAL_TABLET | Freq: Every evening | ORAL | Status: DC | PRN
  Filled 2014-03-10: qty 1

## 2014-03-10 NOTE — Telephone Encounter (Signed)
Per Dr Lubertha Basqueaylor's note from 05/2013 pt not a good candidate for blood thinners secondary to falls and previously and declined them.  I left a message on voicemail with this information

## 2014-03-10 NOTE — Progress Notes (Signed)
PROGRESS NOTE  [name] [id number] DOB: February 19, 1924 DOA: 03/07/2014 PCP: [name]  Assessment/Plan: Hip fracture  -acute left intertrochanteric fracture of femur.  -s/p surgery 3/17 -IV fentanyl - limit as prone to delerium -going to be long recovery as patient had poor functional status before surgery  ABLA -transfused 2 unit  neuropathy  Continue gabapentin and tramadol   atrial fibrillation  Continue the amiodarone, at present and normal sinus rhythm  Not on any anticoagulation due to GI bleed   carotid artery stenosis , AAA  continue to monitor, not on any antiplatelets due to GI bleed.  Continue atenolol   chronic diarrhea  Continue Cholestyramine  Severe malnutrition -regular diet   Code Status: dnr Family Communication: wife at bedside Disposition Plan: admit   Consultants:  ortho  Procedures:     HPI/Subjective: Asking for water Pain in hip  Objective: Filed Vitals:   03/10/14 1108  BP:   Pulse:   Temp: 99.6 F (37.6 C)  Resp:     Intake/Output Summary (Last 24 hours) at 03/10/14 1150 Last data filed at 03/10/14 0630  Gross per 24 hour  Intake 1614.17 ml  Output   1400 ml  Net 214.17 ml   Filed Weights   03/07/14 2129 03/08/14 0053  Weight: 50.803 kg (112 lb) 52.9 kg (116 lb 10 oz)    Exam:  General: chronically ill appearing, hard of hearing  Cardiovascular: S1 and S2 Present, no Murmur, Peripheral Pulses Present  Respiratory: Bilateral Air entry equal and Decreased, no Crackles,no wheezes  Abdomen: Bowel Sound Present, Soft and Non tender  Extremities: no Pedal edema, no calf tenderness, pulses palpable      Data Reviewed: Basic Metabolic Panel:  Recent Labs Lab 03/07/14 2148 03/08/14 0509 03/09/14 1142 03/10/14 0620  NA 137 137 136* 139  K 4.7 4.9 5.5* 4.4  CL 105 102 105 104  CO2 21 20 20 21   GLUCOSE 121* 180* 122* 126*  BUN 21 23 23  26*  CREATININE 1.69* 1.64* 1.50* 1.61*  CALCIUM  8.9 8.9 8.3* 8.5   Liver Function Tests: No results found for this basename: AST, ALT, ALKPHOS, BILITOT, PROT, ALBUMIN,  in the last 168 hours No results found for this basename: LIPASE, AMYLASE,  in the last 168 hours No results found for this basename: AMMONIA,  in the last 168 hours CBC:  Recent Labs Lab 03/07/14 2148 03/08/14 0509 03/09/14 1142 03/10/14 0620  WBC 8.4 15.9* 14.9* 16.1*  NEUTROABS 5.9  --   --   --   HGB 12.0* 11.0* 6.6* 9.5*  HCT 35.4* 33.4* 19.8* 27.5*  MCV 101.4* 102.5* 102.6* 90.8  PLT 134* 122* 87* 80*   Cardiac Enzymes: No results found for this basename: CKTOTAL, CKMB, CKMBINDEX, TROPONINI,  in the last 168 hours BNP (last 3 results) No results found for this basename: PROBNP,  in the last 8760 hours CBG: No results found for this basename: GLUCAP,  in the last 168 hours  Recent Results (from the past 240 hour(s))  SURGICAL PCR SCREEN     Status: None   Collection Time    03/08/14  2:57 AM      Result Value Ref Range Status   MRSA, PCR NEGATIVE  NEGATIVE Final   Staphylococcus aureus NEGATIVE  NEGATIVE Final   Comment:            The Xpert SA Assay (FDA     approved for NASAL specimens     in patients  over 66 years of age),     is one component of     a comprehensive surveillance     program.  Test performance has     been validated by [employer] for patients greater     than or equal to 85 year old.     It is not intended     to diagnose infection nor to     guide or monitor treatment.     Studies: Dg Hip Operative Left  03/09/2014   CLINICAL DATA:  Left intertrochanteric nail insertion  EXAM: OPERATIVE LEFT HIP  FLUOROSCOPY TIME:  53 seconds  COMPARISON:  DG HIP COMPLETE*L* dated 03/07/2014  FINDINGS: Two spot intraoperative fluoroscopic images of the left hip are provided for review.  Images demonstrate the sequela of intramedullary rod fixation of the proximal femur as well as dynamic screw fixation of the left femoral neck. Note,  the caudal aspect of the intra medullary femoral rod is excluded from view. There is persistent mild displacement of the lesser trochanter. Alignment otherwise appears markedly improved. There is expected subcutaneous emphysema about the operative site. No radiopaque foreign body.  IMPRESSION: Post ORIF of the left femur without evidence of complication.   Electronically Signed   By: [name] M.D.   On: 03/09/2014 08:57    Scheduled Meds: . amiodarone  100 mg Oral BID  . atenolol  25 mg Oral Daily  . cholestyramine  2 g Oral Daily  . enoxaparin (LOVENOX) injection  30 mg Subcutaneous Q24H  . gabapentin  300 mg Oral BID  . pantoprazole  40 mg Oral Daily  . senna  1 tablet Oral BID   Continuous Infusions: . sodium chloride    . sodium chloride 125 mL/hr at 03/10/14 0442   Antibiotics Given (last 72 hours)   Date/Time Action Medication Dose Rate   03/09/14 0537 Given   clindamycin (CLEOCIN) IVPB 900 mg 900 mg 100 mL/hr   03/09/14 2058 Given   clindamycin (CLEOCIN) IVPB 600 mg 600 mg 100 mL/hr      Principal Problem:   Hip fracture Active Problems:   NEUROPATHY   Atrial fibrillation   CAROTID ARTERY STENOSIS   ABDOMINAL AORTIC ANEURYSM   PPM-[employer]   Protein-calorie malnutrition, severe    Time spent: 35 min    [name]  [facility] Pager [phone]. If 7PM-7AM, please contact night-coverage at [email or web address], password [name] 03/10/2014, 11:50 AM  LOS: 3 days

## 2014-03-10 NOTE — Progress Notes (Signed)
CSW awaiting PT/OPT notes. Patient's wife expressed that she might want to take the patient home if able. CSW will continue to follow.  Sabino NiemannAmy Navayah Sok, MSW, [employer]WA [phone]

## 2014-03-10 NOTE — Telephone Encounter (Signed)
Patients wife requested that you call her back in regards to the voicemail that you left her earlier. She would like more info. Thanks, MI

## 2014-03-10 NOTE — Evaluation (Signed)
Physical Therapy Evaluation Patient Details Name: [name] MRN: [id number] DOB: May 15, 1924 Today's Date: 03/10/2014 Time: [id number] PT Time Calculation (min): 22 min  PT Assessment / Plan / Recommendation History of Present Illness  s/p left hip intertrochanteric IM nailing  Clinical Impression  Patient is s/p left hip intertrochanteric IM nail surgery resulting in functional limitations due to the deficits listed below (see PT Problem List). Patient will benefit from skilled PT to increase their independence and safety with mobility to allow discharge to the venue listed below. Pt very lethargic during PT evaluation today, will assess further functional ability tomorrow.    PT Assessment  Patient needs continued PT services    Follow Up Recommendations  SNF;Supervision/Assistance - 24 hour    Does the patient have the potential to tolerate intense rehabilitation      Barriers to Discharge Decreased caregiver support Lives alone with wife. Pt is max assist at current and this would be very difficult for wife to safely care for patient    Equipment Recommendations  Wheelchair (measurements PT) (Wife states current w/c is too heavy for her use)    Recommendations for Other Services OT consult   Frequency Min 3X/week    Precautions / Restrictions Precautions Precautions: Fall Restrictions Weight Bearing Restrictions: Yes LLE Weight Bearing: Weight bearing as tolerated   Pertinent Vitals/Pain Pt with no complaint of pain Pt repositioned in chair for comfort SpO2 maintained 98% and greater during therapy.      Mobility  Bed Mobility Overal bed mobility: +2 for physical assistance;Needs Assistance Bed Mobility: Supine to Sit Supine to sit: Max assist General bed mobility comments: Pt very lethargic. Requires max assist +2  for trunk control with sit>supine, however able to sit EOB with Min assist for balance Transfers Overall transfer level: Needs assistance Equipment  used: Rolling walker (2 wheeled) Transfers: Stand Pivot Transfers Stand pivot transfers: Max assist;+2 physical assistance General transfer comment: Pt max assist +2 to control RW and movement of LLE. Pt able to tolerate standing position but leans heavily posterior and to right. Physical assist to move left leg, and frequent verbal cues for sequencing.    Exercises     PT Diagnosis: Difficulty walking;Abnormality of gait;Generalized weakness;Acute pain;Altered mental status  PT Problem List: Decreased strength;Decreased range of motion;Decreased activity tolerance;Decreased balance;Decreased mobility;Decreased knowledge of use of DME;Decreased safety awareness;Decreased knowledge of precautions;Pain;Decreased cognition PT Treatment Interventions: DME instruction;Gait training;Stair training;Functional mobility training;Therapeutic activities;Therapeutic exercise;Balance training;Neuromuscular re-education;Cognitive remediation;Patient/family education;Modalities     PT Goals(Current goals can be found in the care plan section) Acute Rehab PT Goals Patient Stated Goal: none stated PT Goal Formulation: Patient unable to participate in goal setting Time For Goal Achievement: 03/30/14 Potential to Achieve Goals: Fair  Visit Information  Last PT Received On: 03/10/14 Assistance Needed: +2 History of Present Illness: s/p left hip intertrochanteric IM nailing       Prior Functioning  Home Living Family/patient expects to be discharged to:: Skilled nursing facility Living Arrangements: Spouse/significant other Available Help at Discharge: Skilled Nursing Facility Type of Home: House Home Access: Stairs to enter Secretary/administratorntrance Stairs-Number of Steps: 2 Entrance Stairs-Rails: None Home Layout: One level Home Equipment: Environmental consultantWalker - 2 wheels;Bedside commode;Shower seat;Wheelchair - manual (W/c too heavy for wife to lift) Additional Comments: Wife states she would like for husband to d/c to home  where she can care for him 24hr/day with occasional help from their daughter. PT has discussed recommendation for SNF and she states she will "think about it"  Prior Function Level of Independence: Independent with assistive device(s) Comments: Uses RW for all mobility Communication Communication: HOH Dominant Hand: Right    Cognition  Cognition Arousal/Alertness: Lethargic Behavior During Therapy: Flat affect Overall Cognitive Status: Difficult to assess Memory: Decreased short-term memory (per wife) Difficult to assess due to: Level of arousal    Extremity/Trunk Assessment Upper Extremity Assessment Upper Extremity Assessment: Overall WFL for tasks assessed Lower Extremity Assessment Lower Extremity Assessment: Generalized weakness;Difficult to assess due to impaired cognition   Balance Balance Overall balance assessment: Needs assistance Sitting-balance support: Bilateral upper extremity supported Sitting balance-Leahy Scale: Poor Sitting balance - Comments: Min A for sitting EOB Standing balance support: Bilateral upper extremity supported Standing balance-Leahy Scale: Zero Standing balance comment: Max assist for balance in standing with RW  End of Session PT - End of Session Equipment Utilized During Treatment: Gait belt;Oxygen Activity Tolerance: Patient limited by lethargy Patient left: in chair;with call bell/phone within reach;with family/visitor present Nurse Communication: Mobility status;Other (comment) (Pt in chair asleep with wife in room)  GP    [facility] [place], [place] [phone]   Berton Mount 03/10/2014, 3:41 PM

## 2014-03-10 NOTE — Progress Notes (Signed)
OT Cancellation Note  Patient Details Name: [name] MRN: [id number] DOB: 11-26-1924   Cancelled Treatment:    Reason Eval/Treat Not Completed: Other (comment) Pt is Medicare and current D/C plan is SNF. No apparent immediate acute care OT needs, therefore will defer OT to SNF. If OT eval is needed please call Acute Rehab Dept. at [phone] or text page OT at [phone].   Rae LipsMiller, Hildred Pharo M [phone] 03/10/2014, 4:02 PM

## 2014-03-10 NOTE — Telephone Encounter (Signed)
New Message:  Pt's wife states [facility] Roda [facility]tersXu [facility] is wanting to put her husband back on a blood thinner... She states Dr. Ladona Ridgelaylor took him off of a blood thinner a while back and she wants to get Dr. Lubertha Basqueaylor's opinion.

## 2014-03-10 NOTE — Anesthesia Postprocedure Evaluation (Signed)
  Anesthesia Post-op Note  Patient: [name]  Procedure(s) Performed: Procedure(s): INTRAMEDULLARY (IM) NAIL INTERTROCHANTRIC LEFT HIP (Left)  Patient Location: PACU  Anesthesia Type:General  Level of Consciousness: awake, oriented, sedated and patient cooperative  Airway and Oxygen Therapy: Patient Spontanous Breathing  Post-op Pain: mild  Post-op Assessment: Post-op Vital signs reviewed, Patient's Cardiovascular Status Stable, Respiratory Function Stable, Patent Airway, No signs of Nausea or vomiting and Pain level controlled  Post-op Vital Signs: stable  Complications: No apparent anesthesia complications

## 2014-03-10 NOTE — Progress Notes (Signed)
Patient resting in bed. Responded well to transfusion Dressing c/d/i Continue PT  N. Glee ArvinMichael Clayvon Parlett, MD [facility] [phone] 10:30 AM

## 2014-03-10 NOTE — Telephone Encounter (Signed)
Okay to take the Xarelto s/p hip fracture for 3 weeks per Dr Ladona Ridgelaylor

## 2014-03-11 ENCOUNTER — Encounter: Payer: Self-pay | Admitting: *Deleted

## 2014-03-11 DIAGNOSIS — D539 Nutritional anemia, unspecified: Secondary | ICD-10-CM

## 2014-03-11 LAB — CBC
HCT: 25.9 % — ABNORMAL LOW (ref 39.0–52.0)
HEMOGLOBIN: 8.7 g/dL — AB (ref 13.0–17.0)
MCH: 30.9 pg (ref 26.0–34.0)
MCHC: 33.6 g/dL (ref 30.0–36.0)
MCV: 91.8 fL (ref 78.0–100.0)
Platelets: 86 10*3/uL — ABNORMAL LOW (ref 150–400)
RBC: 2.82 MIL/uL — AB (ref 4.22–5.81)
RDW: 19.2 % — ABNORMAL HIGH (ref 11.5–15.5)
WBC: 13.4 10*3/uL — AB (ref 4.0–10.5)

## 2014-03-11 LAB — BASIC METABOLIC PANEL
BUN: 32 mg/dL — ABNORMAL HIGH (ref 6–23)
CHLORIDE: 105 meq/L (ref 96–112)
CO2: 19 meq/L (ref 19–32)
Calcium: 8.4 mg/dL (ref 8.4–10.5)
Creatinine, Ser: 1.63 mg/dL — ABNORMAL HIGH (ref 0.50–1.35)
GFR calc Af Amer: 42 mL/min — ABNORMAL LOW (ref >90)
GFR calc non Af Amer: 36 mL/min — ABNORMAL LOW (ref >90)
GLUCOSE: 109 mg/dL — AB (ref 70–99)
POTASSIUM: 4.5 meq/L (ref 3.7–5.3)
SODIUM: 135 meq/L — AB (ref 137–147)

## 2014-03-11 NOTE — Progress Notes (Signed)
Patient more alert and sitting up without requiring oxygen. Dressings with slight drainage AFVSS Continue PT Will need SNF placement Ok to switch to xarelto x 3 weeks from lovenox from ortho standpoint  N. Glee ArvinMichael Floy Angert, MD [facility] [phone] 8:03 AM

## 2014-03-11 NOTE — Care Management Note (Signed)
CARE MANAGEMENT NOTE 03/11/2014  Patient:  [name],[name]   Account Number:  [id number]  Date Initiated:  03/10/2014  Documentation initiated by:  Vance PeperBRADY,Wister Hoefle  Subjective/Objective Assessment:   80 yr old male s/p left hip IM Nailing subsequent to a fall.     Action/Plan:   [facility] Social worker is aware.  [facility].   Anticipated DC Date:  03/12/2014   Anticipated DC Plan:  SKILLED NURSING FACILITY  In-house referral  Clinical Social Worker      DC Planning Services  CM consult      Choice offered to / List presented to:             Status of service:  Completed, signed off Medicare Important Message given?   (If response is "NO", the following Medicare IM given date fields will be blank) Date Medicare IM given:   Date Additional Medicare IM given:    Discharge Disposition:  SKILLED NURSING FACILITY  Per UR Regulation:

## 2014-03-11 NOTE — Clinical Social Work Psychosocial (Signed)
Clinical Social Work Department  BRIEF PSYCHOSOCIAL ASSESSMENT  Patient: [name] Account Number: [id number]  Admit date: 03/07/14  Clinical Social Worker Rhea Pink, MSW Date/Time: 03/10/14 3:30  PM Referred by: Physician Date Referred:  Referred for   SNF Placement   Other Referral:  Interview type: Patient's wife. Patient was sound asleep  Other interview type: PSYCHOSOCIAL DATA  Living Status: Spouse Admitted from facility:  Level of care:  Primary support name: [name] Primary support relationship to patient: Spouse Degree of support available:  Strong and vested- Patient's wife is always at the bedside  CURRENT CONCERNS  Current Concerns   Post-Acute Placement   Other Concerns:  SOCIAL WORK ASSESSMENT / PLAN  CSW met with pt's wife re: PT recommendation for SNF.   Pt lives with his spouse but she feels she can't take care of him at home  CSW explained placement process and answered questions.   Pt reports [facility] as her preference    CSW completed FL2 and initiated SNF search.     Assessment/plan status: Information/Referral to [employer]  Other assessment/ plan:  Information/referral to community resources:  SNF   PTAR  PATIENT'S/FAMILY'S RESPONSE TO PLAN OF CARE:  Pt's wife stated that she would really like to take the patient home and was tearful about this but she understands that she can't take care of the patient inhis current shape. CSW offered support and reassured her this was probabaly the best thing for the patient. Pt.'s wife reports she is agreeable to ST SNF in order for the patient to increase strength and independence with mobility prior to returning home  Pt verbalized understanding of placement process and appreciation for CSW assist.   Rhea Pink, MSW, [place]

## 2014-03-11 NOTE — Progress Notes (Addendum)
PROGRESS NOTE  [name] ZOX:[id number] DOB: 10/15/1924 DOA: 03/07/2014 PCP: Ginette OttoSTONEKING,HAL THOMAS, MD  Assessment/Plan: Hip fracture  -acute left intertrochanteric fracture of femur.  -s/p surgery 3/17 -tylenol for pan control -going to be long recovery as patient had poor functional status before surgery  ABLA -transfused 2 unit  neuropathy  Continue gabapentin and tramadol   atrial fibrillation  Continue the amiodarone, at present and normal sinus rhythm  Not on any anticoagulation due to GI bleed and fall   carotid artery stenosis , AAA  continue to monitor, not on any antiplatelets due to GI bleed/falls.  Continue atenolol   chronic diarrhea  Continue Cholestyramine  Severe malnutrition -regular diet   Code Status: came in as dnr (full after surgery) Family Communication: daughter at bedside Disposition Plan: family agreeable [facility]   Consultants:  ortho  Procedures:     HPI/Subjective: More awake, per family, still not eating well  Objective: Filed Vitals:   03/11/14 0640  BP: 109/69  Pulse: 71  Temp: 99.1 F (37.3 C)  Resp: 16   No intake or output data in the 24 hours ending 03/11/14 1008 Filed Weights   03/07/14 2129 03/08/14 0053  Weight: 50.803 kg (112 lb) 52.9 kg (116 lb 10 oz)    Exam:  General: chronically ill appearing, hard of hearing  Cardiovascular: S1 and S2 Present, no Murmur, Peripheral Pulses Present  Respiratory: Bilateral Air entry equal and Decreased, no Crackles,no wheezes  Abdomen: Bowel Sound Present, Soft and Non tender  Extremities: no Pedal edema, no calf tenderness, pulses palpable      Data Reviewed: Basic Metabolic Panel:  Recent Labs Lab 03/07/14 2148 03/08/14 0509 03/09/14 1142 03/10/14 0620 03/11/14 0555  NA 137 137 136* 139 135*  K 4.7 4.9 5.5* 4.4 4.5  CL 105 102 105 104 105  CO2 21 20 20 21 19   GLUCOSE 121* 180* 122* 126* 109*  BUN 21 23 23  26* 32*  CREATININE 1.69* 1.64*  1.50* 1.61* 1.63*  CALCIUM 8.9 8.9 8.3* 8.5 8.4   Liver Function Tests: No results found for this basename: AST, ALT, ALKPHOS, BILITOT, PROT, ALBUMIN,  in the last 168 hours No results found for this basename: LIPASE, AMYLASE,  in the last 168 hours No results found for this basename: AMMONIA,  in the last 168 hours CBC:  Recent Labs Lab 03/07/14 2148 03/08/14 0509 03/09/14 1142 03/10/14 0620 03/11/14 0555  WBC 8.4 15.9* 14.9* 16.1* 13.4*  NEUTROABS 5.9  --   --   --   --   HGB 12.0* 11.0* 6.6* 9.5* 8.7*  HCT 35.4* 33.4* 19.8* 27.5* 25.9*  MCV 101.4* 102.5* 102.6* 90.8 91.8  PLT 134* 122* 87* 80* 86*   Cardiac Enzymes: No results found for this basename: CKTOTAL, CKMB, CKMBINDEX, TROPONINI,  in the last 168 hours BNP (last 3 results) No results found for this basename: PROBNP,  in the last 8760 hours CBG: No results found for this basename: GLUCAP,  in the last 168 hours  Recent Results (from the past 240 hour(s))  SURGICAL PCR SCREEN     Status: None   Collection Time    03/08/14  2:57 AM      Result Value Ref Range Status   MRSA, PCR NEGATIVE  NEGATIVE Final   Staphylococcus aureus NEGATIVE  NEGATIVE Final   Comment:            The Xpert SA Assay (FDA     approved for NASAL  specimens     in patients over 62 years of age),     is one component of     a comprehensive surveillance     program.  Test performance has     been validated by [employer] for patients greater     than or equal to 76 year old.     It is not intended     to diagnose infection nor to     guide or monitor treatment.     Studies: No results found.  Scheduled Meds: . amiodarone  100 mg Oral BID  . atenolol  25 mg Oral Daily  . cholestyramine  2 g Oral Daily  . enoxaparin (LOVENOX) injection  30 mg Subcutaneous Q24H  . gabapentin  300 mg Oral BID  . pantoprazole  40 mg Oral Daily  . senna  1 tablet Oral BID   Continuous Infusions: . sodium chloride 125 mL/hr at 03/10/14 0442    Antibiotics Given (last 72 hours)   Date/Time Action Medication Dose Rate   03/09/14 0537 Given   clindamycin (CLEOCIN) IVPB 900 mg 900 mg 100 mL/hr   03/09/14 2058 Given   clindamycin (CLEOCIN) IVPB 600 mg 600 mg 100 mL/hr      Principal Problem:   Hip fracture Active Problems:   NEUROPATHY   Atrial fibrillation   CAROTID ARTERY STENOSIS   ABDOMINAL AORTIC ANEURYSM   PPM-[employer]   Protein-calorie malnutrition, severe    Time spent: 35 min    Geoffrey Hynes  [facility] Pager [phone]. If 7PM-7AM, please contact night-coverage at [email or web address], password [facility] 03/11/2014, 10:08 AM  LOS: 4 days

## 2014-03-11 NOTE — Progress Notes (Signed)
NUTRITION FOLLOW-UP  DOCUMENTATION CODES Per approved criteria  -Underweight -Severe malnutrition in the context of social or environmental circumstances.    INTERVENTION:  - Continue Regular diet  - Continue Carnation Instant Breakfast Vanilla mixed with lactose free milk TID between meals.    NUTRITION DIAGNOSIS: Malnutrition related to very poor appetite, possibly dementia as evidenced by intake </= 50% in >/= 1 month and severe fat and muscle wasting; ongoing.   Goal: - Prevent further weight loss   Monitor:  - Oral Intake   - Weight status   ASSESSMENT: Pt admitted with left hip fracture s/p being found on floor by daughter. Pt is experiencing chronic diarrhea, pt is now on cholestyramine and per daughter has helped. Pt still has loose stools which he is incontinent.   Wife at bedside, pt sleeping. Pt had about ~5% of his breakfast this morning. Per wife pt is drinking his CIB.   Height: Ht Readings from Last 1 Encounters:  03/08/14 5\' 10"  (1.778 m)    Weight: Wt Readings from Last 1 Encounters:  03/08/14 116 lb 10 oz (52.9 kg)  Admission weight 112 lb (50.8 kg)  BMI:  Body mass index is 16.73 kg/(m^2).  Estimated Nutritional Needs: Kcal: 1300 - 1500   Protein: 60g - 70g  Fluid: 1500 mL    Skin: L Elbow Wound.  Diet Order: General Meal Completion: </= 50%   No intake or output data in the 24 hours ending 03/11/14 0845  Last BM: 3/15  Recent Labs Lab 03/09/14 1142 03/10/14 0620 03/11/14 0555  NA 136* 139 135*  K 5.5* 4.4 4.5  CL 105 104 105  CO2 20 21 19   BUN 23 26* 32*  CREATININE 1.50* 1.61* 1.63*  CALCIUM 8.3* 8.5 8.4  GLUCOSE 122* 126* 109*    CBG (last 3)  No results found for this basename: GLUCAP,  in the last 72 hours  Scheduled Meds: . amiodarone  100 mg Oral BID  . atenolol  25 mg Oral Daily  . cholestyramine  2 g Oral Daily  . enoxaparin (LOVENOX) injection  30 mg Subcutaneous Q24H  . gabapentin  300 mg Oral BID  .  pantoprazole  40 mg Oral Daily  . senna  1 tablet Oral BID    Continuous Infusions: . sodium chloride 125 mL/hr at 03/10/14 0442    Past Medical History  Diagnosis Date  . HLD (hyperlipidemia)   . HTN (hypertension)   . Atrial fibrillation   . Arthritis   . GERD (gastroesophageal reflux disease)   . Diverticulitis of colon with bleeding   . Carotid artery stenosis   . Colon polyps   . Kidney stones     Past Surgical History  Procedure Laterality Date  . Fetal surgery for congenital hernia    . Appendectomy    . Cataract extraction      bilateral  . Shoulder surgery    . Pacemaker insertion    . Carotid endarterectomy    . Kidney stone surgery      removal on right side  . Colonoscopy  2007  . Hernia repair      Intern note/chart reviewed. Revisions made.  Kendell BaneHeather Vee Bahe RD, LDN, CNSC [phone] Pager [phone] After Hours Pager

## 2014-03-11 NOTE — Clinical Social Work Placement (Addendum)
Clinical Social Work Department  CLINICAL SOCIAL WORK PLACEMENT NOTE  Patient: [name] Account Number: [id number]  Admit date: 03/07/14 Clinical Social Worker: Sabino NiemannAmy Janijah Symons LCSWA Date/time: 03/10/2014 2:30 PM  Clinical Social Work is seeking post-discharge placement for this patient at the following level of care: SKILLED NURSING (*CSW will update this form in Epic as items are completed)  03/10/2014 Patient/family provided with [facility] of Clinical Social Work's list of facilities offering this level of care within the geographic area requested by the patient (or if unable, by the patient's family).  03/10/2014 Patient/family informed of their freedom to choose among providers that offer the needed level of care, that participate in Medicare, Medicaid or managed care program needed by the patient, have an available bed and are willing to accept the patient.  03/10/2014 Patient/family informed of [facility]' ownership interest in [facility], as well as of the fact that they are under no obligation to receive care at this facility.  PASARR submitted to EDS on 03/12/2014 PASARR number received from EDS on 03/12/2014  FL2 transmitted to all facilities in geographic area requested by pt/family on 03/10/2014  FL2 transmitted to all facilities within larger geographic area on  Patient informed that his/her managed care company has contracts with or will negotiate with certain facilities, including the following:  Patient/family informed of bed offers received:  [facility] Phys[facility] recommends and patient chooses bed at  Patient to be transferred to on 03/12/2014 [facility] The following physician request were entered in Epic:  Additional Comments:

## 2014-03-11 NOTE — Progress Notes (Signed)
Physical Therapy Treatment Patient Details Name: [name] MRN: [id number] DOB: 1924-03-04 Today's Date: 03/11/2014 Time: [id number] PT Time Calculation (min): 22 min  PT Assessment / Plan / Recommendation  History of Present Illness s/p left hip intertrochanteric IM nailing   PT Comments   Pt is more alert today and able to converse briefly and pleasently with physical therapy. Small improvement in bed mobility and ability to lift surgical leg during transfer, however still requires Max assist +2 for stand pivot transfer. Pt would benefit from continued skilled physical therapy in SNF.  PT will continue to work with patient until d/c.   Follow Up Recommendations  SNF;Supervision/Assistance - 24 hour     Does the patient have the potential to tolerate intense rehabilitation     Barriers to Discharge        Equipment Recommendations  Wheelchair (measurements PT) (Wife states current w/c is too heavy for her use)    Recommendations for Other Services OT consult  Frequency Min 3X/week   Progress towards PT Goals Progress towards PT goals: Progressing toward goals  Plan Current plan remains appropriate    Precautions / Restrictions Precautions Precautions: Fall Restrictions Weight Bearing Restrictions: Yes LLE Weight Bearing: Weight bearing as tolerated   Pertinent Vitals/Pain Pt states he is not in any pain Pt repositioned in chair for comfort    Mobility  Bed Mobility Overal bed mobility: +2 for physical assistance;Needs Assistance Bed Mobility: Supine to Sit Supine to sit: Mod assist;+2 for physical assistance General bed mobility comments: Pt mod assist +2 for trunk support with supine>sit. needs assistance to bring hips forward on bed. Transfers Overall transfer level: Needs assistance Equipment used: Rolling walker (2 wheeled) Transfers: Sit to/from [employer]Stand;Stand Pivot Transfers Sit to Stand: Mod assist;+2 physical assistance Stand pivot transfers: Max assist;+2  physical assistance General transfer comment: Pt max assist +2 to control RW and movement of LLE, however demonstrates ability today to lift LLE compared to no control of LLE yesterday. Pt able to tolerate standing position but fatigues quickly and began to lean forward over RW . Physical assist to move left leg, and frequent verbal cues for sequencing. Pt was not able to turn fully for pivot and chair needed to be brought under patient. Urinary incontinence upon standing.    Exercises General Exercises - Lower Extremity Ankle Circles/Pumps: PROM;Both;10 reps;Supine Quad Sets: AAROM;Right;10 reps;Supine   PT Diagnosis:    PT Problem List:   PT Treatment Interventions:     PT Goals (current goals can now be found in the care plan section) Acute Rehab PT Goals PT Goal Formulation: Patient unable to participate in goal setting Time For Goal Achievement: 02/24/2014 Potential to Achieve Goals: Fair  Visit Information  Last PT Received On: 03/11/14 Assistance Needed: +2 History of Present Illness: s/p left hip intertrochanteric IM nailing    Subjective Data  Subjective: Pt states he would like to get into chair today because he sore from being in bed. Wife present and states he tolerated sitting up in chair yesterday very well.   Cognition  Cognition Arousal/Alertness: Lethargic Behavior During Therapy: Flat affect Overall Cognitive Status: Difficult to assess Memory: Decreased short-term memory (per wife) Difficult to assess due to: Level of arousal    Balance  Balance Sitting-balance support: Bilateral upper extremity supported Sitting balance-Leahy Scale: Poor Sitting balance - Comments: Min A for sitting EOB Standing balance support: Bilateral upper extremity supported Standing balance-Leahy Scale: Zero  End of Session PT - End of Session  Equipment Utilized During Treatment: Gait belt;Oxygen Activity Tolerance: Patient limited by lethargy Patient left: in chair;with call  bell/phone within reach;with family/visitor present Nurse Communication: Mobility status   GP    [facility] [place], [place] [phone]  [place] Mount 03/11/2014, 3:59 PM

## 2014-03-12 DIAGNOSIS — N183 Chronic kidney disease, stage 3 unspecified: Secondary | ICD-10-CM | POA: Diagnosis present

## 2014-03-12 DIAGNOSIS — D62 Acute posthemorrhagic anemia: Secondary | ICD-10-CM

## 2014-03-12 LAB — CBC
HEMATOCRIT: 25.6 % — AB (ref 39.0–52.0)
Hemoglobin: 8.7 g/dL — ABNORMAL LOW (ref 13.0–17.0)
MCH: 31.6 pg (ref 26.0–34.0)
MCHC: 34 g/dL (ref 30.0–36.0)
MCV: 93.1 fL (ref 78.0–100.0)
Platelets: 124 10*3/uL — ABNORMAL LOW (ref 150–400)
RBC: 2.75 MIL/uL — AB (ref 4.22–5.81)
RDW: 18.9 % — ABNORMAL HIGH (ref 11.5–15.5)
WBC: 13.3 10*3/uL — ABNORMAL HIGH (ref 4.0–10.5)

## 2014-03-12 MED ORDER — CHOLESTYRAMINE 4 GM/DOSE PO POWD: 2.0000 g | Freq: Every day | ORAL | Status: DC | PRN

## 2014-03-12 MED ORDER — ONDANSETRON HCL 4 MG PO TABS: 4.0000 mg | ORAL_TABLET | Freq: Four times a day (QID) | ORAL | Status: AC | PRN

## 2014-03-12 MED ORDER — ACETAMINOPHEN 325 MG PO TABS: 650.0000 mg | ORAL_TABLET | Freq: Four times a day (QID) | ORAL | Status: DC | PRN

## 2014-03-12 MED ORDER — TETRAHYDROZOLINE HCL 0.05 % OP SOLN: 2.0000 [drp] | Freq: Four times a day (QID) | OPHTHALMIC | Status: AC | PRN

## 2014-03-12 MED ORDER — ENOXAPARIN SODIUM 30 MG/0.3ML ~~LOC~~ SOLN: 30.0000 mg | SUBCUTANEOUS | Status: AC

## 2014-03-12 MED ORDER — SENNA 8.6 MG PO TABS: 1.0000 | ORAL_TABLET | Freq: Two times a day (BID) | ORAL | Status: AC | PRN

## 2014-03-12 MED ORDER — TRAMADOL HCL 50 MG PO TABS: 50.0000 mg | ORAL_TABLET | Freq: Four times a day (QID) | ORAL | Status: DC | PRN

## 2014-03-12 MED ORDER — AYR SALINE NASAL NA GEL: 1.0000 "application " | Freq: Four times a day (QID) | NASAL | Status: AC | PRN

## 2014-03-12 MED ORDER — TRAMADOL HCL 50 MG PO TABS: 50.0000 mg | ORAL_TABLET | Freq: Four times a day (QID) | ORAL | Status: AC | PRN

## 2014-03-12 MED ORDER — POLYETHYLENE GLYCOL 3350 17 G PO PACK: 17.0000 g | PACK | Freq: Every day | ORAL | Status: AC | PRN

## 2014-03-12 MED ORDER — LACTASE 3000 UNITS PO TABS: 3000.0000 [IU] | ORAL_TABLET | Freq: Three times a day (TID) | ORAL | Status: AC | PRN

## 2014-03-12 MED ORDER — ACETAMINOPHEN 325 MG PO TABS
325.0000 mg | ORAL_TABLET | ORAL | Status: AC | PRN
Start: 2014-03-12 — End: ?

## 2014-03-12 MED ORDER — ZINC OXIDE 40 % EX OINT: 1.0000 "application " | TOPICAL_OINTMENT | CUTANEOUS | Status: AC | PRN

## 2014-03-12 MED ORDER — MELATONIN 3 MG PO CAPS: ORAL_CAPSULE | ORAL | Status: DC

## 2014-03-12 NOTE — Progress Notes (Signed)
[facility]

## 2014-03-12 NOTE — Progress Notes (Signed)
Physical Therapy Treatment Patient Details Name: [name] MRN: [id number] DOB: Sep 11, 1924 Today'Reynolds Date: 03/12/2014 Time: [id number] PT Time Calculation (min): 12 min  PT Assessment / Plan / Recommendation  History of Present Illness Reynolds/p left hip intertrochanteric IM nailing   PT Comments   Pt very [name] PT focused on active assist and passive ROM as tolerated to lower extremities for strength and ROM. PT will continue to follow until discharge and will benefit from skilled PT services while pt is alert.  Follow Up Recommendations  SNF;Supervision/Assistance - 24 hour     Does the patient have the potential to tolerate intense rehabilitation     Barriers to Discharge        Equipment Recommendations  None recommended by PT    Recommendations for Other Services    Frequency Min 3X/week   Progress towards PT Goals Progress towards PT goals: Not progressing toward goals - comment  Plan Current plan remains appropriate    Precautions / Restrictions Precautions Precautions: Fall Restrictions Weight Bearing Restrictions: Yes LLE Weight Bearing: Weight bearing as tolerated   Pertinent Vitals/Pain Pt states he is not in pain at the moment Pt repositioned in bed for comfort with head elevated  SpO2 maintained 88-90% during therapy on room air.   Mobility       Exercises General Exercises - Lower Extremity Ankle Circles/Pumps: AAROM;Both;10 reps;Supine Short Arc Quad: AAROM;Left;10 reps;Supine Heel Slides: AAROM;Both;5 reps;Supine (Coupled with PROM at end range) Hip ABduction/ADduction: AAROM;Both;5 reps;Supine   PT Diagnosis:    PT Problem List:   PT Treatment Interventions:     PT Goals (current goals can now be found in the care plan section) Acute Rehab PT Goals PT Goal Formulation: Patient unable to participate in goal setting Time For Goal Achievement: 02-28-2014 Potential to Achieve  Goals: Fair  Visit Information  Last PT Received On: 03/12/14 Assistance Needed: +1 History of Present Illness: Reynolds/p left hip intertrochanteric IM nailing    Subjective Data  Subjective: Pt states he does not want to get in the chair   Cognition  Cognition Arousal/Alertness: Lethargic Behavior During Therapy: Flat affect Overall Cognitive Status: Difficult to assess    Balance     End of Session PT - End of Session Equipment Utilized During Treatment: Gait belt Activity Tolerance: Patient limited by lethargy Patient left: in bed;with family/visitor present;with bed alarm set Nurse Communication: Mobility status   [name]    [name], [name] [phone]  Willie [place], [name] 03/12/2014, 11:46 AM

## 2014-03-12 NOTE — Progress Notes (Signed)
Patient comfortable in bed Dressing c/d/i No acute issues Plan to dc to [facility] today F/u 2 weeks  N. Glee ArvinMichael Xu, MD [facility] [phone] 12:54 PM

## 2014-03-12 NOTE — Discharge Summary (Signed)
Physician Discharge Summary  [name]:[id number] DOB: November 13, 1924 DOA: 03/07/2014  PCP: Ginette Otto, MD  Admit date: 03/07/2014 Discharge date: 03/12/2014  Time spent: greater than 30 minutes  Recommendations for Outpatient Follow-up:  1. Monitor H/H  Discharge Diagnoses:  Principal Problem:   Hip fracture Active Problems:   NEUROPATHY   Atrial fibrillation, not on chronic anticoagulation due to fall risk and history of GI bleed   CAROTID ARTERY STENOSIS   ABDOMINAL AORTIC ANEURYSM   PPM-[employer]   Protein-calorie malnutrition, severe   CKD (chronic kidney disease) stage 3, GFR 30-59 ml/min   Discharge Condition: Stable  CODE STATUS: DO NOT RESUSCITATE  Filed Weights   03/07/14 2129 03/08/14 0053  Weight: 50.803 kg (112 lb) 52.9 kg (116 lb 10 oz)    History of present illness:  80 y.o. male with Past medical history of hypertension, atrial fibrillation, GI bleed, carotid artery stenosis status post endarterectomy, 3.8 cm AAA, dyslipidemia, chronic kidney disease.  The patient is coming from home.  The patient was brought in by his daughter who found the patient on the floor in the bathroom. The patient was sitting on a stool next of the bathroom mirror, and possibly has gone for a bowel movement at which time he fell down and was found on the ground. There was no incontinence noted patient was awake and alert patient was complaining of pain in his left hip and therefore he was brought to the hospital.  The patient hit his back of his head and left shoulder.  The daughter and the patient denied any prior fever, chills, headache, cough, chest pain, palpitation, shortness of breath, orthopnea, PND, nausea, vomiting, abdominal pain, diarrhea, constipation, active bleeding, burning urination, dizziness, pedal edema, focal neurological deficit.  He has chronic back pain. He has history of GI bleed and therefore he is not on any antiplatelet or anticoagulation. He has  history of peripheral vascular disease and atrial fibrillation. He has 3.7 cm AAA last noted in 2014.   Hospital Course:  Admitted to hospitalist service. Orthopedics consulted. -acute left intertrochanteric fracture of femur.  -s/p surgery 3/17 with IM implant by Dr. Roda Shutters Has worked with physical therapy and occupational therapy. Transfer to skilled nursing facility today  Acute blood loss anemia on top of chronic anemia -transfused 2 unit packed red blood cells. hemoglobin on admission 11. Postoperatively dropped to 6.6. After 2 units of blood, has been stable at 8.7  neuropathy  Continue gabapentin and tramadol   atrial fibrillation  Continue the amiodarone, at present and normal sinus rhythm  Not on any anticoagulation due to GI bleed and fall   carotid artery stenosis , AAA   Continue atenolol   chronic diarrhea  On cholestyramine daily as an outpatient. This has been held postoperatively to avoid constipation. May resume as needed.   Severe malnutrition  -regular diet   Procedures: Intramedullary implant, left hip by Dr. Roda Shutters 03/09/14  Consultations:  Orthopedics  Discharge Exam: Filed Vitals:   03/12/14 0800  BP:   Pulse:   Temp:   Resp: 16    General: Alert. Comfortable.  Cardiovascular: Regular rate rhythm  Respiratory: Clear to auscultation bilaterally without wheezes rhonchi or rales Abdomen soft nontender nondistended Extremities: No pitting edema. Bruising about the left hip and groin very.  Discharge Instructions  Discharge Orders   Future Appointments Provider Department Dept Phone   06/04/2014 2:30 PM Myeong Eyvonne [facility] [phone]   07/20/2014 1:30 PM [facility] [address] [phone]   Eat a light meal the night before the exam Nothing to eat or drink for at least 8 hours before exam No gum chewing, or smoking the morning of the exam. Please take your morning medications with small sips of water,  especially blood pressure medication *Very Important* Please wear 2 piece clothing   07/20/2014 2:00 PM [facility] [address] [phone]   07/20/2014 3:00 PM Pryor Ochoa, MD [facility] [phone]   Future Orders Complete By Expires   Diet general  As directed    Walk with assistance  As directed    Walker   As directed    Weight bearing as tolerated  As directed    Questions:     Laterality:     Extremity:         Medication List         acetaminophen 325 MG tablet  Commonly known as:  TYLENOL  Take 1-2 tablets (325-650 mg total) by mouth every 4 (four) hours as needed (pain or fever).     amiodarone 200 MG tablet  Commonly known as:  PACERONE  Take 100 mg by mouth 2 (two) times daily.     atenolol 50 MG tablet  Commonly known as:  TENORMIN  Take 25 mg by mouth daily. Half tab daily     cholestyramine 4 GM/DOSE powder  Commonly known as:  QUESTRAN  Take 0.5 packets (2 g total) by mouth daily as needed (constipation).     Cranberry 425 MG Caps  Take 425 mg by mouth daily.     enoxaparin 30 MG/0.3ML injection  Commonly known as:  LOVENOX  Inject 0.3 mLs (30 mg total) into the skin daily. For 3 weeks     gabapentin 300 MG capsule  Commonly known as:  NEURONTIN  Take 300 mg by mouth 2 (two) times daily.     lactase 3000 UNITS tablet  Commonly known as:  LACTAID  Take 1 tablet (3,000 Units total) by mouth 3 (three) times daily with meals as needed (lactose intolerance).     liver oil-zinc oxide 40 % ointment  Commonly known as:  DESITIN  Apply 1 application topically as needed for irritation.     Melatonin 3 MG Caps  qhs prn sleep     omeprazole 20 MG capsule  Commonly known as:  PRILOSEC  Take 20 mg by mouth daily.     ondansetron 4 MG tablet  Commonly known as:  ZOFRAN  Take 1 tablet (4 mg total) by mouth every 6 (six) hours as needed for nausea.     polyethylene glycol packet  Commonly  known as:  MIRALAX / GLYCOLAX  Take 17 g by mouth daily as needed for mild constipation.     saccharomyces boulardii 250 MG capsule  Commonly known as:  FLORASTOR  Take 250 mg by mouth 3 (three) times daily.     saline Gel  Place 1 application into the nose every 6 (six) hours as needed (nasal dryness).     senna 8.6 MG Tabs tablet  Commonly known as:  SENOKOT  Take 1 tablet (8.6 mg total) by mouth 2 (two) times daily as needed for mild constipation.     tetrahydrozoline 0.05 % ophthalmic solution  Commonly known as:  VISINE  Place 2 drops into both eyes 4 (four) times daily as needed (dry or itchy eyes).     traMADol 50  MG tablet  Commonly known as:  ULTRAM  Take 1 tablet (50 mg total) by mouth every 6 (six) hours as needed for moderate pain.       Allergies  Allergen Reactions  . Penicillins Anaphylaxis  . Streptomycin Anaphylaxis  . Lactose Intolerance (Gi)        Follow-up Information   Follow up with Cheral AlmasXu, Naiping Michael, MD In 2 weeks.   Specialty:  Orthopedic Surgery   Contact information:   [address] [place] [id number] [phone]     For followup and staple removal   The results of significant diagnostics from this hospitalization (including imaging, microbiology, ancillary and laboratory) are listed below for reference.    Significant Diagnostic Studies: Dg Chest 1 View  03/07/2014   CLINICAL DATA:  Status post fall.  Left hip fracture.  EXAM: CHEST - 1 VIEW  COMPARISON:  PA and lateral chest 09/24/2012.  FINDINGS: Lung volumes are low but the lungs are clear. Heart size is normal. Pacing device is in place. No pneumothorax or pleural effusion. Multiple thoracic compression fracture are identified with postoperative change of vertebral augmentation noted.  IMPRESSION: No acute disease.   Electronically Signed   By: Drusilla Kannerhomas  Dalessio M.D.   On: 03/07/2014 23:06   Dg Hip Complete Left  03/07/2014   CLINICAL DATA:  Status post fall.  Left hip  pain.  EXAM: LEFT HIP - COMPLETE 2+ VIEW  COMPARISON:  None.  FINDINGS: There is an acute left intertrochanteric fracture. No other acute bony or joint abnormality is identified. Bones are osteopenic. The patient is status post vertebral augmentation lower lumbar spine.  IMPRESSION: Acute left intertrochanteric fracture.   Electronically Signed   By: Drusilla Kannerhomas  Dalessio M.D.   On: 03/07/2014 23:05   Dg Hip Operative Left  03/09/2014   CLINICAL DATA:  Left intertrochanteric nail insertion  EXAM: OPERATIVE LEFT HIP  FLUOROSCOPY TIME:  53 seconds  COMPARISON:  DG HIP COMPLETE*L* dated 03/07/2014  FINDINGS: Two spot intraoperative fluoroscopic images of the left hip are provided for review.  Images demonstrate the sequela of intramedullary rod fixation of the proximal femur as well as dynamic screw fixation of the left femoral neck. Note, the caudal aspect of the intra medullary femoral rod is excluded from view. There is persistent mild displacement of the lesser trochanter. Alignment otherwise appears markedly improved. There is expected subcutaneous emphysema about the operative site. No radiopaque foreign body.  IMPRESSION: Post ORIF of the left femur without evidence of complication.   Electronically Signed   By: Simonne ComeJohn  Watts M.D.   On: 03/09/2014 08:57   Ct Head Wo Contrast  03/08/2014   CLINICAL DATA:  Status post fall.  EXAM: CT HEAD WITHOUT CONTRAST  CT CERVICAL SPINE WITHOUT CONTRAST  TECHNIQUE: Multidetector CT imaging of the head and cervical spine was performed following the standard protocol without intravenous contrast. Multiplanar CT image reconstructions of the cervical spine were also generated.  COMPARISON:  Head CT scan 08/26/2010.  FINDINGS: CT HEAD FINDINGS  There is cortical atrophy and chronic microvascular ischemic change. No evidence of acute abnormality including infarction, hemorrhage, mass lesion, mass effect, midline shift or abnormal extra-axial fluid collection is identified. There is a  small radiopaque foreign body in the subcutaneous tissues anterior to the frontal bone, unchanged.  CT CERVICAL SPINE FINDINGS  No fracture or malalignment of the cervical spine is identified. Multilevel degenerative change is noted. Lung apices are clear.  IMPRESSION: No acute finding head or cervical  spine.   Electronically Signed   By: Drusilla Kanner M.D.   On: 03/08/2014 02:51   Ct Cervical Spine Wo Contrast  03/08/2014   CLINICAL DATA:  Status post fall.  EXAM: CT HEAD WITHOUT CONTRAST  CT CERVICAL SPINE WITHOUT CONTRAST  TECHNIQUE: Multidetector CT imaging of the head and cervical spine was performed following the standard protocol without intravenous contrast. Multiplanar CT image reconstructions of the cervical spine were also generated.  COMPARISON:  Head CT scan 08/26/2010.  FINDINGS: CT HEAD FINDINGS  There is cortical atrophy and chronic microvascular ischemic change. No evidence of acute abnormality including infarction, hemorrhage, mass lesion, mass effect, midline shift or abnormal extra-axial fluid collection is identified. There is a small radiopaque foreign body in the subcutaneous tissues anterior to the frontal bone, unchanged.  CT CERVICAL SPINE FINDINGS  No fracture or malalignment of the cervical spine is identified. Multilevel degenerative change is noted. Lung apices are clear.  IMPRESSION: No acute finding head or cervical spine.   Electronically Signed   By: Drusilla Kanner M.D.   On: 03/08/2014 02:51   EKG  Sinus rhythm Borderline prolonged PR interval Prolonged QT interval  Microbiology: Recent Results (from the past 240 hour(s))  SURGICAL PCR SCREEN     Status: None   Collection Time    03/08/14  2:57 AM      Result Value Ref Range Status   MRSA, PCR NEGATIVE  NEGATIVE Final   Staphylococcus aureus NEGATIVE  NEGATIVE Final   Comment:            The Xpert SA Assay (FDA     approved for NASAL specimens     in patients over 13 years of age),     is one component  of     a comprehensive surveillance     program.  Test performance has     been validated by [employer] for patients greater     than or equal to 10 year old.     It is not intended     to diagnose infection nor to     guide or monitor treatment.     Labs: Basic Metabolic Panel:  Recent Labs Lab 03/07/14 2148 03/08/14 0509 03/09/14 1142 03/10/14 0620 03/11/14 0555  NA 137 137 136* 139 135*  K 4.7 4.9 5.5* 4.4 4.5  CL 105 102 105 104 105  CO2 21 20 20 21 19   GLUCOSE 121* 180* 122* 126* 109*  BUN 21 23 23  26* 32*  CREATININE 1.69* 1.64* 1.50* 1.61* 1.63*  CALCIUM 8.9 8.9 8.3* 8.5 8.4   Liver Function Tests: No results found for this basename: AST, ALT, ALKPHOS, BILITOT, PROT, ALBUMIN,  in the last 168 hours No results found for this basename: LIPASE, AMYLASE,  in the last 168 hours No results found for this basename: AMMONIA,  in the last 168 hours CBC:  Recent Labs Lab 03/07/14 2148 03/08/14 0509 03/09/14 1142 03/10/14 0620 03/11/14 0555 03/12/14 0436  WBC 8.4 15.9* 14.9* 16.1* 13.4* 13.3*  NEUTROABS 5.9  --   --   --   --   --   HGB 12.0* 11.0* 6.6* 9.5* 8.7* 8.7*  HCT 35.4* 33.4* 19.8* 27.5* 25.9* 25.6*  MCV 101.4* 102.5* 102.6* 90.8 91.8 93.1  PLT 134* 122* 87* 80* 86* 124*   Cardiac Enzymes: No results found for this basename: CKTOTAL, CKMB, CKMBINDEX, TROPONINI,  in the last 168 hours BNP: BNP (last 3 results) No  results found for this basename: PROBNP,  in the last 8760 hours CBG: No results found for this basename: GLUCAP,  in the last 168 hours     Signed:  Marquist Binstock L  [facility] 03/12/2014, 11:32 AM

## 2014-03-12 NOTE — Progress Notes (Signed)
Clinical social worker assisted with patient discharge to skilled nursing facility, [facility].  CSW addressed all family questions and concerns. CSW copied chart and added all important documents. CSW also set up patient transportation with [facility]. Clinical Social Worker will sign off for now as social work intervention is no longer needed.   Leotha Voeltz, MSW, LCSWA [phone] 

## 2014-03-15 ENCOUNTER — Encounter: Payer: Self-pay | Admitting: *Deleted

## 2014-03-16 ENCOUNTER — Encounter (HOSPITAL_COMMUNITY): Payer: Self-pay | Admitting: Orthopaedic Surgery

## 2014-03-17 ENCOUNTER — Encounter (HOSPITAL_COMMUNITY): Payer: Self-pay | Admitting: Emergency Medicine

## 2014-03-17 ENCOUNTER — Emergency Department (HOSPITAL_COMMUNITY): Payer: Medicare Other

## 2014-03-17 ENCOUNTER — Inpatient Hospital Stay (HOSPITAL_COMMUNITY)
Admission: EM | Admit: 2014-03-17 | Discharge: 2014-03-24 | DRG: 871 | Disposition: E | Payer: Medicare Other | Attending: Family Medicine | Admitting: Family Medicine

## 2014-03-17 DIAGNOSIS — A419 Sepsis, unspecified organism: Principal | ICD-10-CM | POA: Diagnosis present

## 2014-03-17 DIAGNOSIS — E785 Hyperlipidemia, unspecified: Secondary | ICD-10-CM | POA: Diagnosis present

## 2014-03-17 DIAGNOSIS — K219 Gastro-esophageal reflux disease without esophagitis: Secondary | ICD-10-CM | POA: Diagnosis present

## 2014-03-17 DIAGNOSIS — S7290XA Unspecified fracture of unspecified femur, initial encounter for closed fracture: Secondary | ICD-10-CM | POA: Diagnosis present

## 2014-03-17 DIAGNOSIS — Z515 Encounter for palliative care: Secondary | ICD-10-CM

## 2014-03-17 DIAGNOSIS — R64 Cachexia: Secondary | ICD-10-CM | POA: Diagnosis present

## 2014-03-17 DIAGNOSIS — I214 Non-ST elevation (NSTEMI) myocardial infarction: Secondary | ICD-10-CM | POA: Diagnosis present

## 2014-03-17 DIAGNOSIS — IMO0002 Reserved for concepts with insufficient information to code with codable children: Secondary | ICD-10-CM

## 2014-03-17 DIAGNOSIS — Z681 Body mass index (BMI) 19 or less, adult: Secondary | ICD-10-CM

## 2014-03-17 DIAGNOSIS — Z8249 Family history of ischemic heart disease and other diseases of the circulatory system: Secondary | ICD-10-CM

## 2014-03-17 DIAGNOSIS — E872 Acidosis, unspecified: Secondary | ICD-10-CM | POA: Diagnosis present

## 2014-03-17 DIAGNOSIS — R6521 Severe sepsis with septic shock: Secondary | ICD-10-CM

## 2014-03-17 DIAGNOSIS — I4891 Unspecified atrial fibrillation: Secondary | ICD-10-CM | POA: Diagnosis present

## 2014-03-17 DIAGNOSIS — Z66 Do not resuscitate: Secondary | ICD-10-CM | POA: Diagnosis present

## 2014-03-17 DIAGNOSIS — Z87891 Personal history of nicotine dependence: Secondary | ICD-10-CM

## 2014-03-17 DIAGNOSIS — R6520 Severe sepsis without septic shock: Secondary | ICD-10-CM

## 2014-03-17 DIAGNOSIS — J189 Pneumonia, unspecified organism: Secondary | ICD-10-CM | POA: Diagnosis present

## 2014-03-17 DIAGNOSIS — M129 Arthropathy, unspecified: Secondary | ICD-10-CM | POA: Diagnosis present

## 2014-03-17 DIAGNOSIS — R230 Cyanosis: Secondary | ICD-10-CM | POA: Diagnosis present

## 2014-03-17 DIAGNOSIS — N183 Chronic kidney disease, stage 3 unspecified: Secondary | ICD-10-CM | POA: Diagnosis present

## 2014-03-17 DIAGNOSIS — K7200 Acute and subacute hepatic failure without coma: Secondary | ICD-10-CM | POA: Diagnosis present

## 2014-03-17 DIAGNOSIS — J9690 Respiratory failure, unspecified, unspecified whether with hypoxia or hypercapnia: Secondary | ICD-10-CM | POA: Diagnosis present

## 2014-03-17 DIAGNOSIS — Z79899 Other long term (current) drug therapy: Secondary | ICD-10-CM

## 2014-03-17 DIAGNOSIS — I739 Peripheral vascular disease, unspecified: Secondary | ICD-10-CM | POA: Diagnosis present

## 2014-03-17 DIAGNOSIS — X58XXXA Exposure to other specified factors, initial encounter: Secondary | ICD-10-CM | POA: Diagnosis present

## 2014-03-17 DIAGNOSIS — E43 Unspecified severe protein-calorie malnutrition: Secondary | ICD-10-CM | POA: Diagnosis present

## 2014-03-17 DIAGNOSIS — D649 Anemia, unspecified: Secondary | ICD-10-CM | POA: Diagnosis present

## 2014-03-17 DIAGNOSIS — I129 Hypertensive chronic kidney disease with stage 1 through stage 4 chronic kidney disease, or unspecified chronic kidney disease: Secondary | ICD-10-CM | POA: Diagnosis present

## 2014-03-17 DIAGNOSIS — Z950 Presence of cardiac pacemaker: Secondary | ICD-10-CM

## 2014-03-17 DIAGNOSIS — Z880 Allergy status to penicillin: Secondary | ICD-10-CM

## 2014-03-17 DIAGNOSIS — J9600 Acute respiratory failure, unspecified whether with hypoxia or hypercapnia: Secondary | ICD-10-CM | POA: Diagnosis present

## 2014-03-17 LAB — COMPREHENSIVE METABOLIC PANEL
ALBUMIN: 2.5 g/dL — AB (ref 3.5–5.2)
ALK PHOS: 113 U/L (ref 39–117)
ALT: 75 U/L — AB (ref 0–53)
AST: 112 U/L — ABNORMAL HIGH (ref 0–37)
BUN: 62 mg/dL — AB (ref 6–23)
CO2: 15 mEq/L — ABNORMAL LOW (ref 19–32)
Calcium: 8.7 mg/dL (ref 8.4–10.5)
Chloride: 102 mEq/L (ref 96–112)
Creatinine, Ser: 1.68 mg/dL — ABNORMAL HIGH (ref 0.50–1.35)
GFR calc Af Amer: 41 mL/min — ABNORMAL LOW (ref >90)
GFR calc non Af Amer: 35 mL/min — ABNORMAL LOW (ref >90)
Glucose, Bld: 158 mg/dL — ABNORMAL HIGH (ref 70–99)
Potassium: 5.1 mEq/L (ref 3.7–5.3)
SODIUM: 139 meq/L (ref 137–147)
TOTAL PROTEIN: 5.7 g/dL — AB (ref 6.0–8.3)
Total Bilirubin: 2.4 mg/dL — ABNORMAL HIGH (ref 0.3–1.2)

## 2014-03-17 LAB — CBC
HCT: 31.5 % — ABNORMAL LOW (ref 39.0–52.0)
Hemoglobin: 10.4 g/dL — ABNORMAL LOW (ref 13.0–17.0)
MCH: 31.1 pg (ref 26.0–34.0)
MCHC: 33 g/dL (ref 30.0–36.0)
MCV: 94.3 fL (ref 78.0–100.0)
Platelets: 307 K/uL (ref 150–400)
RBC: 3.34 MIL/uL — ABNORMAL LOW (ref 4.22–5.81)
RDW: 19.3 % — ABNORMAL HIGH (ref 11.5–15.5)
WBC: 21.4 K/uL — ABNORMAL HIGH (ref 4.0–10.5)

## 2014-03-17 LAB — I-STAT CG4 LACTIC ACID, ED: Lactic Acid, Venous: 6.72 mmol/L — ABNORMAL HIGH (ref 0.5–2.2)

## 2014-03-17 LAB — TROPONIN I: TROPONIN I: 7.97 ng/mL — AB (ref <0.30)

## 2014-03-17 LAB — PRO B NATRIURETIC PEPTIDE: Pro B Natriuretic peptide (BNP): 9427 pg/mL — ABNORMAL HIGH (ref 0–450)

## 2014-03-17 LAB — LIPASE, BLOOD: LIPASE: 31 U/L (ref 11–59)

## 2014-03-17 MED ORDER — POLYVINYL ALCOHOL 1.4 % OP SOLN
1.0000 [drp] | Freq: Four times a day (QID) | OPHTHALMIC | Status: DC | PRN
Start: 2014-03-17 — End: 2014-03-17
  Filled 2014-03-17: qty 15

## 2014-03-17 MED ORDER — SODIUM CHLORIDE 0.9 % IV SOLN
1000.0000 mL | Freq: Once | INTRAVENOUS | Status: AC
  Administered 2014-03-17: 1000 mL via INTRAVENOUS

## 2014-03-17 MED ORDER — ALBUTEROL SULFATE (2.5 MG/3ML) 0.083% IN NEBU
2.5000 mg | INHALATION_SOLUTION | Freq: Four times a day (QID) | RESPIRATORY_TRACT | Status: DC
  Administered 2014-03-17: 2.5 mg via RESPIRATORY_TRACT
  Filled 2014-03-17: qty 3

## 2014-03-17 MED ORDER — IPRATROPIUM BROMIDE 0.02 % IN SOLN
0.5000 mg | Freq: Four times a day (QID) | RESPIRATORY_TRACT | Status: DC
  Administered 2014-03-17: 0.5 mg via RESPIRATORY_TRACT
  Filled 2014-03-17: qty 2.5

## 2014-03-17 MED ORDER — SODIUM CHLORIDE 0.9 % IV SOLN: INTRAVENOUS | Status: DC

## 2014-03-17 MED ORDER — MORPHINE (PF) INJECTION FOR INHALATION 10 MG/ML: 10.0000 mg | RESPIRATORY_TRACT | Status: DC | PRN

## 2014-03-17 MED ORDER — PROMETHAZINE HCL 12.5 MG PO TABS
12.5000 mg | ORAL_TABLET | Freq: Four times a day (QID) | ORAL | Status: DC | PRN
  Filled 2014-03-17: qty 1

## 2014-03-17 MED ORDER — ONDANSETRON HCL 4 MG/2ML IJ SOLN: 4.0000 mg | Freq: Three times a day (TID) | INTRAMUSCULAR | Status: AC | PRN

## 2014-03-17 MED ORDER — LORAZEPAM 2 MG/ML IJ SOLN
1.0000 mg | Freq: Once | INTRAMUSCULAR | Status: AC
  Administered 2014-03-17: 1 mg via INTRAVENOUS
  Filled 2014-03-17: qty 1

## 2014-03-17 MED ORDER — LORAZEPAM 2 MG/ML IJ SOLN: 1.0000 mg | INTRAMUSCULAR | Status: DC | PRN

## 2014-03-17 MED ORDER — ASPIRIN 300 MG RE SUPP: 300.0000 mg | Freq: Once | RECTAL | Status: DC

## 2014-03-17 MED ORDER — LORAZEPAM 2 MG/ML PO CONC
1.0000 mg | ORAL | Status: DC | PRN
  Administered 2014-03-18: 1 mg via SUBLINGUAL
  Filled 2014-03-17: qty 1

## 2014-03-17 MED ORDER — DEXTROSE 5 % IV SOLN
1.0000 g | Freq: Three times a day (TID) | INTRAVENOUS | Status: DC
  Administered 2014-03-18: 1 g via INTRAVENOUS
  Filled 2014-03-17 (×3): qty 1

## 2014-03-17 MED ORDER — GUAIFENESIN-DM 100-10 MG/5ML PO SYRP
5.0000 mL | ORAL_SOLUTION | ORAL | Status: DC | PRN
  Filled 2014-03-17: qty 5

## 2014-03-17 MED ORDER — HALOPERIDOL LACTATE 2 MG/ML PO CONC
0.5000 mg | ORAL | Status: DC | PRN
  Filled 2014-03-17: qty 0.3

## 2014-03-17 MED ORDER — VANCOMYCIN HCL IN DEXTROSE 1-5 GM/200ML-% IV SOLN
1000.0000 mg | Freq: Once | INTRAVENOUS | Status: AC
  Administered 2014-03-17: 1000 mg via INTRAVENOUS
  Filled 2014-03-17: qty 200

## 2014-03-17 MED ORDER — DEXTROSE 5 % IV SOLN
2.0000 g | Freq: Once | INTRAVENOUS | Status: AC
  Administered 2014-03-17: 2 g via INTRAVENOUS
  Filled 2014-03-17: qty 2

## 2014-03-17 MED ORDER — ALBUTEROL SULFATE (2.5 MG/3ML) 0.083% IN NEBU: 2.5000 mg | INHALATION_SOLUTION | RESPIRATORY_TRACT | Status: DC | PRN

## 2014-03-17 MED ORDER — HYDROMORPHONE HCL PF 1 MG/ML IJ SOLN
1.0000 mg | INTRAMUSCULAR | Status: AC | PRN
  Administered 2014-03-18 (×2): 1 mg via INTRAVENOUS
  Filled 2014-03-17 (×3): qty 1

## 2014-03-17 MED ORDER — SODIUM CHLORIDE 0.9 % IV SOLN: 1000.0000 mL | INTRAVENOUS | Status: DC

## 2014-03-17 MED ORDER — SODIUM CHLORIDE 0.9 % IV SOLN
INTRAVENOUS | Status: DC
  Administered 2014-03-18: 01:00:00 via INTRAVENOUS
  Administered 2014-03-18: 10 mL/h via INTRAVENOUS

## 2014-03-17 MED ORDER — SCOPOLAMINE 1 MG/3DAYS TD PT72
1.0000 | MEDICATED_PATCH | TRANSDERMAL | Status: DC
  Filled 2014-03-17: qty 1

## 2014-03-17 MED ORDER — DIPHENHYDRAMINE HCL 50 MG/ML IJ SOLN: 12.5000 mg | INTRAMUSCULAR | Status: DC | PRN

## 2014-03-17 MED ORDER — MORPHINE SULFATE 4 MG/ML IJ SOLN
4.0000 mg | Freq: Once | INTRAMUSCULAR | Status: AC
  Administered 2014-03-17: 4 mg via INTRAVENOUS
  Filled 2014-03-17: qty 1

## 2014-03-17 MED ORDER — MORPHINE SULFATE 2 MG/ML IJ SOLN: 1.0000 mg | INTRAMUSCULAR | Status: DC | PRN

## 2014-03-17 NOTE — Progress Notes (Addendum)
ANTIBIOTIC CONSULT NOTE - INITIAL  Pharmacy Consult for Aztreonam Indication: HCAP  Allergies  Allergen Reactions  . Penicillins Anaphylaxis  . Streptomycin Anaphylaxis  . Lactose Intolerance (Gi)     Patient Measurements:   Adjusted Body Weight: n/a  Vital Signs: Temp: 97.3 F (36.3 C) (03/25 1900) Temp src: Rectal (03/25 1900) BP: 124/104 mmHg (03/25 1835) Pulse Rate: 71 (03/25 1835) Intake/Output from previous day:   Intake/Output from this shift:    Labs:  Recent Labs  02/28/2014 1935  WBC 21.4*  HGB 10.4*  PLT 307   The CrCl is unknown because both a height and weight (above a minimum accepted value) are required for this calculation. No results found for this basename: VANCOTROUGH, Leodis BinetVANCOPEAK, VANCORANDOM, GENTTROUGH, GENTPEAK, GENTRANDOM, TOBRATROUGH, TOBRAPEAK, TOBRARND, AMIKACINPEAK, AMIKACINTROU, AMIKACIN,  in the last 72 hours   Microbiology: Recent Results (from the past 720 hour(s))  SURGICAL PCR SCREEN     Status: None   Collection Time    03/08/14  2:57 AM      Result Value Ref Range Status   MRSA, PCR NEGATIVE  NEGATIVE Final   Staphylococcus aureus NEGATIVE  NEGATIVE Final   Comment:            The Xpert SA Assay (FDA     approved for NASAL specimens     in patients over 78 years of age),     is one component of     a comprehensive surveillance     program.  Test performance has     been validated by [facility] for patients greater     than or equal to 78 year old.     It is not intended     to diagnose infection nor to     guide or monitor treatment.    Medical History: Past Medical History  Diagnosis Date  . HLD (hyperlipidemia)   . HTN (hypertension)   . Atrial fibrillation   . Arthritis   . GERD (gastroesophageal reflux disease)   . Diverticulitis of colon with bleeding   . Carotid artery stenosis   . Colon polyps   . Kidney stones     Medications:   (Not in a hospital admission) Assessment: [age] YOM with SOB to  start empiric Aztreonam therapy for HCAP due to anaphylaxis listed to penicillin. WBC elevated at 21.4. CrCl ~ 24 mL/min   Cultures: 3/25 Blood Cx x2>>  Abx given in ED 1) Aztreonam 2 gm IV x 1 dose 2) Vancomycin 1 gm IV x 1 dose   Goal of Therapy:  Eradication of Infection   Plan:  1) Aztreonam 1 gm IV Q 8 hours  2) Monitor CBC, renal fx, cultures and patient clinical status  Vinnie LevelBenjamin Mancheril, PharmD.  Clinical Pharmacist Pager [phone]  Addum:  Add vancomycin.  Rec'd 1gm in ED.  Will start 500 mg IV q24 hours.  Goal VT 15-20.  Thanks for allowing pharmacy to be a part of this patient's care.  Talbert CageLora Aston Lieske, PharmD Clinical Pharmacist, [phone]

## 2014-03-17 NOTE — Progress Notes (Signed)
Chaplain requested to offer support to patient's daughter, RN said patient is at end of life and palliative care has been requested. Chaplain presented to patient's daughter in ED, who was crying. Patient's wife is at home, daughter said her mother is "very OCD about germs." Daughter will be alone with her father tonight. Her brother is on his way home. She described tense relationship with her brother. Chaplain listened empathically to her concerns, providing emotional, spiritual, and grief support, and a caring presence.   Will follow up and refer to unit chaplain. Please page overnight if needed.   Maurene CapesHillary D Irusta [phone]

## 2014-03-17 NOTE — ED Notes (Signed)
CG-4 result shown to Dr. Micheline Mazeocherty

## 2014-03-17 NOTE — H&P (Signed)
PCP:   Ginette OttoSTONEKING,HAL THOMAS, MD   Chief Complaint:  Fever, sob for several days  HPI: 80 yo male h/o recent hip fracture s/p repair last week, htn, pacemaker, afib, AAA, PVD was d/c last week to rehab.  Was doing ok after surgery.  Daughter is present who provides all of the history.  She has been with her dad since he went to rehab, staying with him at night.  Since Friday (about 5 days ago) he started running low grade fever which has continued to get higher up to over 102 over the last 2 days.   He has been coughing a lot, and becoming more sob. His oxygen sats and blood pressure.have been intermittently low for the past 24 hours and was turning blue around his mouth yesterday, yesterday she requested for him to come to the hospital, but was not sent.  He has become more confused.  A cxr was done this am and pna was diagnosed, levaquin first dose was given at noon today.  Patient progressively became severely sob and more anxious today, at which point the daughter demanded he be sent to the hospital.  On arrival to ED, patient was in severe respiratory failure and septic shock.  Pt did express his wishes to his family last week before surgery that he want to be DNR/I and daughter and wife wish to honor this.  He does have a wife of 65 years who is at home, and agrees with DNR/I.    Review of Systems:  Per dtr as above  Past Medical History: Past Medical History  Diagnosis Date  . HLD (hyperlipidemia)   . HTN (hypertension)   . Atrial fibrillation   . Arthritis   . GERD (gastroesophageal reflux disease)   . Diverticulitis of colon with bleeding   . Carotid artery stenosis   . Colon polyps   . Kidney stones    Past Surgical History  Procedure Laterality Date  . Fetal surgery for congenital hernia    . Appendectomy    . Cataract extraction      bilateral  . Shoulder surgery    . Pacemaker insertion    . Carotid endarterectomy    . Kidney stone surgery      removal on right side   . Colonoscopy  2007  . Hernia repair    . Intramedullary (im) nail intertrochanteric Left 03/09/2014    Procedure: INTRAMEDULLARY (IM) NAIL INTERTROCHANTRIC LEFT HIP;  Surgeon: Cheral AlmasNaiping Michael Xu, MD;  Location: [facility] OR;  Service: Orthopedics;  Laterality: Left;    Medications: Prior to Admission medications   Medication Sig Start Date End Date Taking? Authorizing Provider  acetaminophen (TYLENOL) 325 MG tablet Take 1-2 tablets (325-650 mg total) by mouth every 4 (four) hours as needed (pain or fever). 03/12/14   Christiane Haorinna L Sullivan, MD  amiodarone (PACERONE) 200 MG tablet Take 100 mg by mouth 2 (two) times daily.     Historical Provider, MD  atenolol (TENORMIN) 50 MG tablet Take 25 mg by mouth daily. Half tab daily 10/31/12   Marinus MawGregg W Taylor, MD  cholestyramine Lanetta Inch(QUESTRAN) 4 GM/DOSE powder Take 0.5 packets (2 g total) by mouth daily as needed (constipation). 03/12/14   Christiane Haorinna L Sullivan, MD  Cranberry 425 MG CAPS Take 425 mg by mouth daily.    Historical Provider, MD  enoxaparin (LOVENOX) 30 MG/0.3ML injection Inject 0.3 mLs (30 mg total) into the skin daily. For 3 weeks 03/12/14   Christiane Haorinna L Sullivan, MD  gabapentin (NEURONTIN)  300 MG capsule Take 300 mg by mouth 2 (two) times daily.     Historical Provider, MD  lactase (LACTAID) 3000 UNITS tablet Take 1 tablet (3,000 Units total) by mouth 3 (three) times daily with meals as needed (lactose intolerance). 03/12/14   Christiane Ha, MD  liver oil-zinc oxide (DESITIN) 40 % ointment Apply 1 application topically as needed for irritation. 03/12/14   Christiane Ha, MD  Melatonin 3 MG CAPS qhs prn sleep 03/12/14   Christiane Ha, MD  omeprazole (PRILOSEC) 20 MG capsule Take 20 mg by mouth daily.     Historical Provider, MD  ondansetron (ZOFRAN) 4 MG tablet Take 1 tablet (4 mg total) by mouth every 6 (six) hours as needed for nausea. 03/12/14   Christiane Ha, MD  polyethylene glycol (MIRALAX / GLYCOLAX) packet Take 17 g by mouth daily as needed  for mild constipation. 03/12/14   Christiane Ha, MD  saccharomyces boulardii (FLORASTOR) 250 MG capsule Take 250 mg by mouth 3 (three) times daily.    Historical Provider, MD  saline (AYR) GEL Place 1 application into the nose every 6 (six) hours as needed (nasal dryness). 03/12/14   Christiane Ha, MD  senna (SENOKOT) 8.6 MG TABS tablet Take 1 tablet (8.6 mg total) by mouth 2 (two) times daily as needed for mild constipation. 03/12/14   Christiane Ha, MD  tetrahydrozoline (VISINE) 0.05 % ophthalmic solution Place 2 drops into both eyes 4 (four) times daily as needed (dry or itchy eyes). 03/12/14   Christiane Ha, MD  traMADol (ULTRAM) 50 MG tablet Take 1 tablet (50 mg total) by mouth every 6 (six) hours as needed for moderate pain. 03/12/14   Christiane Ha, MD    Allergies:   Allergies  Allergen Reactions  . Penicillins Anaphylaxis  . Streptomycin Anaphylaxis  . Lactose Intolerance (Gi)     Social History:  reports that he quit smoking about 44 years ago. His smoking use included Cigarettes. He smoked 0.00 packs per day. He has never used smokeless tobacco. He reports that he does not drink alcohol or use illicit drugs.  Family History: Family History  Problem Relation Age of Onset  . Diabetes    . Coronary artery disease    . Stroke      Physical Exam: Filed Vitals:   04/07/14 1900 2014/04/07 1900 04-07-14 1904 2014-04-07 2109  BP:      Pulse:    61  Temp:  97.3 F (36.3 C)    TempSrc:  Rectal    Resp:    30  Height:   5' 10.08" (1.78 m)   Weight:   52.9 kg (116 lb 10 oz)   SpO2: 51%  99% 94%   General appearance: severe distress and toxic Head: Normocephalic, without obvious abnormality, atraumatic Eyes: negative Nose: Nares normal. Septum midline. Mucosa normal. No drainage or sinus tenderness. Neck: no JVD and supple, symmetrical, trachea midline Lungs: rhonchi bilaterally Heart: regular rate and rhythm, S1, S2 normal, no murmur, click, rub or  gallop Abdomen: soft, non-tender; bowel sounds normal; no masses,  no organomegaly Extremities: extremities normal, atraumatic, no cyanosis or edema Pulses: 2+ and symmetric Skin: Skin color, texture, turgor normal. No rashes or lesions  Mottling, cyanotic around mouth despite 100% NRB Neurologic: obtunded  Labs on Admission:   Recent Labs  Apr 07, 2014 1935  NA 139  K 5.1  CL 102  CO2 15*  GLUCOSE 158*  BUN 62*  CREATININE  1.68*  CALCIUM 8.7    Recent Labs  2014/04/11 1935  AST 112*  ALT 75*  ALKPHOS 113  BILITOT 2.4*  PROT 5.7*  ALBUMIN 2.5*    Recent Labs  2014-04-11 1935  LIPASE 31    Recent Labs  2014-04-11 1935  WBC 21.4*  HGB 10.4*  HCT 31.5*  MCV 94.3  PLT 307    Recent Labs  11-Apr-2014 1928  TROPONINI 7.97*    Radiological Exams on Admission:  Dg Chest Portable 1 View  2014-04-11   CLINICAL DATA:  Shortness of breath  EXAM: PORTABLE CHEST - 1 VIEW  COMPARISON:  03/07/2014  FINDINGS: Cardiac shadow is stable. Diffuse bilateral infiltrates are identified consistent with diffuse pneumonia. Findings of prior vertebral augmentation are is noted. No acute bony abnormality is seen.  IMPRESSION: Diffuse bilateral infiltrates.  Followup films are recommended.   Electronically Signed   By: Alcide Clever M.D.   On: 04/11/2014 20:24    Assessment/Plan  80 yo male with diffuse bilateral pna/hcap with septic shock  Principal Problem:   Septic shock-  From hcap, in setting of nstemi.  Pt will likely expire in the next 24 hours.  Will cont broad spectrum abx, but doubt it will help.  i have talked to the daughter about deactiviating his pacemaker, she agrees that if his bp continues to decline, we will deactivate at that time.  Main goal of care is comfort care.  Daughter is aware of the severity of his illness, and that he will likely not make it through the night regardless of what we do.  She understands.  Ativan/dilaudid/haldol has been ordered on prn basis to make  sure he is comfortable, daughter is aware of this orders and the potential to decrease his respiratory drive, she understands this and wishes to still give if needed which i agree with.  Active Problems:   Atrial fibrillation   PPM-[employer]tronic   Protein-calorie malnutrition, severe   CKD (chronic kidney disease) stage 3, GFR 30-59 ml/min   Acute respiratory failure   NSTEMI (non-ST elevated myocardial infarction)   HCAP (healthcare-associated pneumonia)   Respiratory failure  DNR/I  Gibran Veselka A 04-11-2014, 10:23 PM

## 2014-03-17 NOTE — Progress Notes (Signed)
Pt's daughter has refused ABG. Pt has been made DNR and daughter doesn't think the test will be beneficial. Dr. Peyton NajjarPippin made aware and [facility]

## 2014-03-17 NOTE — ED Provider Notes (Signed)
CSN: [id number]     Arrival date & time 02/22/2014  1810 History   First MD Initiated Contact with Patient 03/13/2014 1904     Chief Complaint  Patient presents with  . Shortness of Breath    HPI [name] is a 80 y.o. male who presents with respiratory failure.  He recently suffered a hip fracture, which was repaired on 3/17.  He has since been in a skilled nursing facility for rehab.  Prior to this, was living independently at home with his wife.  He has previously stated DNR/DNI wishes, which his family is aware of and supportive of.  On arrival to the ED, nursing docmumented an O2 sat of 8%.  When I entered the room, he was on high flow oxygen via non-rebreather with sats of 50%.  Sats slowly improved over several minutes to 90s.  He was breathing 30+ times per minute.  He is very ill appearing.  He is cachectic.  Over the past several days, he has been running low grade fever; Tmax in the past 24 hours was 102.  He has had a severe cough.  He has been very short of breath, and this is worsening.  He has had some peri-oral cyanosis according to his daughter. Last night, a CXR was done at his rehab facility suggestive of PNA, and he was started on Levaquin.  Today, she asked he be sent to the ED.      Past Medical History  Diagnosis Date  . HLD (hyperlipidemia)   . HTN (hypertension)   . Atrial fibrillation   . Arthritis   . GERD (gastroesophageal reflux disease)   . Diverticulitis of colon with bleeding   . Carotid artery stenosis   . Colon polyps   . Kidney stones    Past Surgical History  Procedure Laterality Date  . Fetal surgery for congenital hernia    . Appendectomy    . Cataract extraction      bilateral  . Shoulder surgery    . Pacemaker insertion    . Carotid endarterectomy    . Kidney stone surgery      removal on right side  . Colonoscopy  2007  . Hernia repair    . Intramedullary (im) nail intertrochanteric Left 03/09/2014    Procedure: INTRAMEDULLARY (IM) NAIL  INTERTROCHANTRIC LEFT HIP;  Surgeon: Cheral Almas, MD;  Location: [facility] OR;  Service: Orthopedics;  Laterality: Left;   Family History  Problem Relation Age of Onset  . Diabetes    . Coronary artery disease    . Stroke     History  Substance Use Topics  . Smoking status: Former Smoker    Types: Cigarettes    Quit date: 12/24/1969  . Smokeless tobacco: Never Used  . Alcohol Use: No    Review of Systems  Unable to perform ROS: Mental status change  Constitutional: Positive for fever, activity change, appetite change and fatigue.  Respiratory: Positive for cough and shortness of breath.   Neurological: Positive for weakness.  Psychiatric/Behavioral: Positive for confusion.      Allergies  Penicillins; Streptomycin; and Lactose intolerance (gi)  Home Medications   No current outpatient prescriptions on file. BP 99/51  Pulse 61  Temp(Src) 100.1 F (37.8 C) (Axillary)  Resp 13  Ht 5' 10.08" (1.78 m)  Wt 121 lb 9.6 oz (55.157 kg)  BMI 17.41 kg/m2  SpO2 98% Physical Exam  Nursing note and vitals reviewed. Constitutional: He appears well-developed. He appears distressed (  severe respiratory distress).  Toxic appearing, cachectic, confused, lethargic, appears uncomfortable  HENT:  Head: Normocephalic and atraumatic.  Dry mucus membranes  Eyes: Conjunctivae are normal. Pupils are equal, round, and reactive to light.  Neck: Normal range of motion. Neck supple. No tracheal deviation present.  Cardiovascular: Regular rhythm, normal heart sounds and intact distal pulses.  Exam reveals no gallop and no friction rub.   No murmur heard. tachycardia  Pulmonary/Chest: No stridor. He is in respiratory distress. He has rales.  sats 50% on non-rebreather  Abdominal: Soft. He exhibits no distension. There is no tenderness. There is no rebound and no guarding.  Musculoskeletal: He exhibits no edema.  Neurological:  Lethargic; oriented to person, not place, time, or situation.   Moves all extremities equally.  PERRL.   Skin: There is pallor.  Cool to touch    ED Course  Procedures (including critical care time) Labs Review Labs Reviewed  CBC - Abnormal; Notable for the following:    WBC 21.4 (*)    RBC 3.34 (*)    Hemoglobin 10.4 (*)    HCT 31.5 (*)    RDW 19.3 (*)    All other components within normal limits  TROPONIN I - Abnormal; Notable for the following:    Troponin I 7.97 (*)    All other components within normal limits  PRO B NATRIURETIC PEPTIDE - Abnormal; Notable for the following:    Pro B Natriuretic peptide (BNP) 9427.0 (*)    All other components within normal limits  COMPREHENSIVE METABOLIC PANEL - Abnormal; Notable for the following:    CO2 15 (*)    Glucose, Bld 158 (*)    BUN 62 (*)    Creatinine, Ser 1.68 (*)    Total Protein 5.7 (*)    Albumin 2.5 (*)    AST 112 (*)    ALT 75 (*)    Total Bilirubin 2.4 (*)    GFR calc non Af Amer 35 (*)    GFR calc Af Amer 41 (*)    All other components within normal limits  I-STAT CG4 LACTIC ACID, ED - Abnormal; Notable for the following:    Lactic Acid, Venous 6.72 (*)    All other components within normal limits  CULTURE, BLOOD (ROUTINE X 2)  CULTURE, BLOOD (ROUTINE X 2)  CLOSTRIDIUM DIFFICILE BY PCR  CULTURE, BLOOD (ROUTINE X 2)  CULTURE, BLOOD (ROUTINE X 2)  LIPASE, BLOOD  LEGIONELLA ANTIGEN, URINE  STREP PNEUMONIAE URINARY ANTIGEN  I-STAT CG4 LACTIC ACID, ED   Imaging Review Dg Chest Portable 1 View  11/21/2014   CLINICAL DATA:  Shortness of breath  EXAM: PORTABLE CHEST - 1 VIEW  COMPARISON:  03/07/2014  FINDINGS: Cardiac shadow is stable. Diffuse bilateral infiltrates are identified consistent with diffuse pneumonia. Findings of prior vertebral augmentation are is noted. No acute bony abnormality is seen.  IMPRESSION: Diffuse bilateral infiltrates.  Followup films are recommended.   Electronically Signed   By: Alcide CleverMark  Lukens M.D.   On: 011/29/2015 20:24     EKG  Interpretation   Date/Time:  Wednesday March 17 2014 19:07:49 EDT Ventricular Rate:  73 PR Interval:  144 QRS Duration: 99 QT Interval:  519 QTC Calculation: 572 R Axis:   -7 Text Interpretation:  Sinus rhythm Probable lateral infarct, age  indeterminate Prolonged QT interval ED PHYSICIAN INTERPRETATION AVAILABLE  IN [facility] Confirmed by TEST, Record ([id number]) on 03/02/2014 7:41:53  AM      MDM   80 yo  M with recent hip fx presents with respiratory failure and septic shock.  1.  Respiratory failure: Sats documented by nursing at 8% on arrival.  He had perioral and nailbed cyanosis.  He was altered.  He was in severe respiratory distress.  My initial impression was he would require intubation/mechanical ventilation to sustain life.  However, daughter present and she made his DNR/DNI wishes, previously stated, evident. - DX with Pneumonia by CXR last night at nursing facility; Tmax 102 over past 24 hours.  Has been started on levaquin prior to arrival. - I order blood culures and Vancomycin and Aztreonam (he has anaphylactic PCN allergy) - sats slowly improved to 90s on NRB, however, continued respiratory distress; mental status improved somewhat with oxygen - given recent hip fracture and hospitalization, immobilization, PE is a consideration as well  2.  Septic shock - he has clinical pneumonia with fever and respiratory failure, infiltrate on CXR - developed worsening hypotension during ED course - lactate 6.7 - multiorgan failure (respiratory failure, renal failure, elevated lactate, elevated troponin), shock liver, pulmonary edema  3.  NSTEMI - troponin 8, likely 2/2 respiratory failure and septic shock  4.  Lactic acidosis  The patient's daughter asked early in ED course that we not do an ABG for comfort; she asked we minimize blood draws and needle sticks; she declined aspirin suppository for NSTEMI.  I think these are all reasonable requests.  His prognosis is grim,  given multiorgan failure, septic shock, severe respiratory failure with DNR/DNI status.  At this point, I initiated lengthy goals of care discussion.  His daughter thinks that the patient would want comfort measures to be the primary goal.  She called and spoke to other family members, including the patient's wife, who cannot be here, and his son.  The family is in agreement to not further escalate care.  Their wish is for palliative management.  They would not want CPR, intubation, central lines for pressors; they would want his ICD turned off to prevent shocks; they want him to receive pain medications and sedative medications to maximize comfort, even with the understanding this may hasten his demise.  They do not want to continue antibiotics, with the understanding that this may prolong his life, and they do not want to prolong suffering given his very poor prognosis.  The daughter thinks that these goals are most in line with patient's previously stated wishes.    Patient is admitted to hospitalist, palliative care floor. I placed orders for comfort care, including prn morphine, ativan, robinul, no IV sticks, q day vitals, etc.  On reevaluation after morphine and palliative measures, patient appears much more comfortable; near normal work of breathing; sleeping.  Hypotensive.  High flow oxygen.  His heart is being paced by his pacemaker at intrinsic pacemaker rate.  Daughter feels he looks more comfortable than he has in days.  Care assumed by hospitalist.  Chaplain also consulted and to bedside.    Final diagnoses:  Acute respiratory failure  Atrial fibrillation  Cardiac pacemaker in situ  CKD (chronic kidney disease) stage 3, GFR 30-59 ml/min  HCAP (healthcare-associated pneumonia)  NSTEMI (non-ST elevated myocardial infarction)  Septic shock  Anemia, unspecified      Toney Sang, MD 04/13/2014 1411

## 2014-03-17 NOTE — ED Notes (Addendum)
[facility] [place] [facility] and DX of PNA. PER EMS pt normally does not care for self post fall in novemeber, dependent on family.  Pt is normally alert and oriented. Not oriented to person or place at this time, not speaking loose stools. Pt is tachypneic, with cyanosis to nailbeds, sallow in color. Uses 3L o2 at all times, on Nebulizer at 8 Liters with sats 94%. Labored with +use of accessory muscles. Warm core to touch. Emaciated.  Given 125 solumedrol and Duoneb by EMS

## 2014-03-18 ENCOUNTER — Encounter (HOSPITAL_COMMUNITY): Payer: Self-pay | Admitting: General Practice

## 2014-03-18 DIAGNOSIS — D649 Anemia, unspecified: Secondary | ICD-10-CM

## 2014-03-18 LAB — CLOSTRIDIUM DIFFICILE BY PCR: Toxigenic C. Difficile by PCR: NEGATIVE

## 2014-03-18 MED ORDER — HYDROMORPHONE HCL PF 1 MG/ML IJ SOLN
1.0000 mg | INTRAMUSCULAR | Status: DC | PRN
  Administered 2014-03-18 – 2014-03-19 (×4): 1 mg via INTRAVENOUS
  Filled 2014-03-18 (×4): qty 1

## 2014-03-18 MED ORDER — SCOPOLAMINE 1 MG/3DAYS TD PT72
1.0000 | MEDICATED_PATCH | TRANSDERMAL | Status: DC
  Administered 2014-03-18: 1.5 mg via TRANSDERMAL
  Filled 2014-03-18: qty 1

## 2014-03-18 MED ORDER — VANCOMYCIN HCL 500 MG IV SOLR
500.0000 mg | INTRAVENOUS | Status: DC
  Filled 2014-03-18: qty 500

## 2014-03-18 MED ORDER — LORAZEPAM 2 MG/ML IJ SOLN
0.5000 mg | INTRAMUSCULAR | Status: DC | PRN
  Administered 2014-03-18 – 2014-03-19 (×8): 0.5 mg via INTRAVENOUS
  Filled 2014-03-18 (×8): qty 1

## 2014-03-18 NOTE — Progress Notes (Signed)
Patient transferred to Room # 21 for more space and comfort for the family.

## 2014-03-18 NOTE — Progress Notes (Signed)
[facility] PROGRESS NOTE  Dickey GaveSam R Kernen ZOX:[id number] DOB: Aug 27, 1924 DOA: 03/14/2014 PCP: Ginette OttoSTONEKING,HAL THOMAS, MD  Assessment/Plan: 1. Septic shock 2. NSTEMI 3. HCAP 4. CKD stage 3 Discussed in detail with patient's daughter, patient is full comfort care. Will discontinue the antibiotics, start scopolamine patch. Continue with prn IV dilaudid.   Code Status: DNR Family Communication: *Discussed with family at bedside Disposition Plan: Anticipate hospital death   Consultants:  none  Procedures:  *None  Antibiotics:  *None  HPI/Subjective: Patient seen and examined, unresponsive.   Objective: Filed Vitals:   03/18/14 1007  BP: 95/37  Pulse: 60  Temp:   Resp:     Intake/Output Summary (Last 24 hours) at 03/18/14 2023 Last data filed at 03/18/14 1900  Gross per 24 hour  Intake    968 ml  Output    400 ml  Net    568 ml   Filed Weights   03/01/2014 1904 03/18/14 0020  Weight: 52.9 kg (116 lb 10 oz) 55.157 kg (121 lb 9.6 oz)    Exam:   General:  Unreponsive  Cardiovascular: S1s2 RRR  Respiratory: bilateral rhonchi  Abdomen: Soft, nontender   Data Reviewed: Basic Metabolic Panel:  Recent Labs Lab 03/06/2014 1935  NA 139  K 5.1  CL 102  CO2 15*  GLUCOSE 158*  BUN 62*  CREATININE 1.68*  CALCIUM 8.7   Liver Function Tests:  Recent Labs Lab 03/06/2014 1935  AST 112*  ALT 75*  ALKPHOS 113  BILITOT 2.4*  PROT 5.7*  ALBUMIN 2.5*    Recent Labs Lab 03/02/2014 1935  LIPASE 31   No results found for this basename: AMMONIA,  in the last 168 hours CBC:  Recent Labs Lab 03/12/14 0436 03/16/2014 1935  WBC 13.3* 21.4*  HGB 8.7* 10.4*  HCT 25.6* 31.5*  MCV 93.1 94.3  PLT 124* 307   Cardiac Enzymes:  Recent Labs Lab 02/22/2014 1928  TROPONINI 7.97*   BNP (last 3 results)  Recent Labs  02/21/2014 1928  PROBNP 9427.0*   CBG: No results found for this basename: GLUCAP,  in the last 168 hours  Recent Results (from the past  240 hour(s))  CLOSTRIDIUM DIFFICILE BY PCR     Status: None   Collection Time    03/18/14 12:08 AM      Result Value Ref Range Status   C difficile by pcr NEGATIVE  NEGATIVE Final     Studies: Dg Chest Portable 1 View  03/12/2014   CLINICAL DATA:  Shortness of breath  EXAM: PORTABLE CHEST - 1 VIEW  COMPARISON:  03/07/2014  FINDINGS: Cardiac shadow is stable. Diffuse bilateral infiltrates are identified consistent with diffuse pneumonia. Findings of prior vertebral augmentation are is noted. No acute bony abnormality is seen.  IMPRESSION: Diffuse bilateral infiltrates.  Followup films are recommended.   Electronically Signed   By: Alcide CleverMark  Lukens M.D.   On: 03/10/2014 20:24    Scheduled Meds: . scopolamine  1 patch Transdermal Q72H   Continuous Infusions: . sodium chloride 10 mL/hr (03/18/14 [id number])    Principal Problem:   Septic shock Active Problems:   Atrial fibrillation   PPM-[employer]tr[employer]   Protein-calorie malnutrition, severe   CKD (chronic kidney disease) stage 3, GFR 30-59 ml/min   Acute respiratory failure   NSTEMI (non-ST elevated myocardial infarction)   HCAP (healthcare-associated pneumonia)   Respiratory failure    Time spent: 25 min    [facility],Vinaya Sancho S  [facility] Pager [phone]. If 7PM-7AM, please contact night-coverage  at [email or web address], password [facility] 03/18/2014, 8:23 PM  LOS: 1 day

## 2014-03-18 NOTE — Progress Notes (Signed)
New Admission Note:   Arrival Method: Via stretcher from the ED with RN Candise BowensJen Mental Orientation: Lethargic  Telemetry: None Assessment: Completed Skin: Left hip incision, Right back leg bruise, excoriated but blanchable sacrum, reddened toes with ted hose on legs IV: Clean dry and intact. Two IVs in left arm placed 3/25. One running NS @ 15600mL/hr Pain: Moaning and making grimicing faces. Pain medication given (check eMAR) Tubes: External foley catheter placed by ED Safety Measures: Safety Fall Prevention Plan has been given, discussed and signed by daughter Admission: Completed [address] [place] Orientation: Patient and family has been orientated to the room, unit and staff.  Family: Daughter at the bedside. Stated she will be staying the night  Orders have been reviewed and implemented. Will continue to monitor the patient. Call light has been placed within reach and bed alarm has been activated.   [employer] Varcoyanne Hill BSN, RN  Phone number: [phone]

## 2014-03-18 NOTE — Progress Notes (Signed)
Patient daughter does not want Labs to be drawn anymore, stated that "the Troponin is a waste of time and that daddy does not have good veins". Daughter also wanted to change the route of the Ativan from sublingual to IV.   Notified on call MD Schorr about family request. New orders were given. Will continue to assess and monitor the patient.   PACCAR Incyanne Hill BSN, RN [facility] [place] [phone]

## 2014-03-18 NOTE — Progress Notes (Signed)
Nutrition Brief Note  Pt with end of life orders, focus on pt comfort. No nutrition intervention at this time. Please Consult RD if needs arise.  Marijean NiemannStephanie Hedda Crumbley Dietetic Intern Pager: [phone]

## 2014-03-18 NOTE — Progress Notes (Signed)
[name] RD, LDN Pager: [phone]   

## 2014-03-18 NOTE — Progress Notes (Signed)
Dilaudid 1 mg IV administered for comfort. We will continue to monitor. Family at bedside.

## 2014-03-19 ENCOUNTER — Encounter (HOSPITAL_COMMUNITY): Payer: Self-pay | Admitting: *Deleted

## 2014-03-19 MED ORDER — SODIUM CHLORIDE 0.9 % IV SOLN
0.5000 mg/h | INTRAVENOUS | Status: DC
  Administered 2014-03-19: 0.5 mg/h via INTRAVENOUS
  Filled 2014-03-19: qty 2.5

## 2014-03-22 ENCOUNTER — Encounter: Payer: Self-pay | Admitting: Internal Medicine

## 2014-03-22 NOTE — ED Provider Notes (Signed)
Medical screening examination/treatment/procedure(s) were conducted as a shared visit with resident-physician practitioner(s) and myself.  I personally evaluated the patient during the encounter.  Pt is a 80 y.o. male with pmhx as above presenting with respiratory failure about 1 week after hip fracture repair.  Pt hypoxic, with inc WOB, RR 30's, febrile, perioral cyanosis..  Pt found to have diffuse BL infiltrates on CXR, septic shock, with multi organ failure evidenced by elevated LA, elevated trop. It became obvious pt acutely ill.  Goals of care discussion had with family by myself and separately with Dr. Peyton NajjarPippin and they have decided to pursue comfort care. Pt admitted to hospitalists service.   CRITICAL CARE Performed by: Toy CookeyHERTY, Cliff Damiani, E Total critical care time: 30mins Critical care time was exclusive of separately billable procedures and treating other patients. Critical care was necessary to treat or prevent imminent or life-threatening deterioration. Critical care was time spent personally by me on the following activities: development of treatment plan with patient and/or surrogate as well as nursing, discussions with consultants, evaluation of patient's response to treatment, examination of patient, obtaining history from patient or surrogate, ordering and performing treatments and interventions, ordering and review of laboratory studies, ordering and review of radiographic studies, pulse oximetry and re-evaluation of patient's condition.    Shanna CiscoMegan E Mantaj Chamberlin, MD 03/22/14 2142

## 2014-03-24 LAB — CULTURE, BLOOD (ROUTINE X 2)
CULTURE: NO GROWTH
CULTURE: NO GROWTH

## 2014-03-24 NOTE — Discharge Summary (Signed)
Death Summary  Dickey GaveSam R Corrigan ZOX:[id number] DOB: September 05, 1924 DOA: 10/21/2014  PCP: Ginette OttoSTONEKING,HAL THOMAS, MD PCP/Office notified: no   Admit date: 10/21/2014 Date of Death: 03/15/2014  Final Diagnoses:  Principal Problem:   Septic shock Active Problems:   Atrial fibrillation   PPM-[employer]   Protein-calorie malnutrition, severe   CKD (chronic kidney disease) stage 3, GFR 30-59 ml/min   Acute respiratory failure   NSTEMI (non-ST elevated myocardial infarction)   HCAP (healthcare-associated pneumonia)   Respiratory failure      History of present illness:  80 yo male h/o recent hip fracture s/p repair last week, htn, pacemaker, afib, AAA, PVD was d/c last week to rehab. Was doing ok after surgery. Daughter is present who provides all of the history. She has been with her dad since he went to rehab, staying with him at night. Since Friday (about 5 days ago) he started running low grade fever which has continued to get higher up to over 102 over the last 2 days. He has been coughing a lot, and becoming more sob. His oxygen sats and blood pressure.have been intermittently low for the past 24 hours and was turning blue around his mouth yesterday, yesterday she requested for him to come to the hospital, but was not sent. He has become more confused. A cxr was done this am and pna was diagnosed, levaquin first dose was given at noon today. Patient progressively became severely sob and more anxious today, at which point the daughter demanded he be sent to the hospital. On arrival to ED, patient was in severe respiratory failure and septic shock. Pt did express his wishes to his family last week before surgery that he want to be DNR/I and daughter and wife wish to honor this. He does have a wife of 65 years who is at home, and agrees with DNR   Hospital Course:  Patient was admitted with Septic shock, NSTEMI, hospital acquired pneumonia. Was unresponsive and family opted for comfort care only.  Discontinued the antibiotics, IV Dilaudid prn, Ativan prn, Scopolamine patch. Dilaudid infusion was started at 0.5 mg/hr on 3/27 for tachypnea, respiratory distress. Patient expired on 3/27 at 1510  Time: 25 min  Signed:  Letha Mirabal S  [facility] 03/04/2014, 3:40 PM

## 2014-03-24 NOTE — Procedures (Signed)
[facility] CitizenCaroline Donor -Number for patient  [phone]

## 2014-03-24 NOTE — Progress Notes (Signed)
[facility] PROGRESS NOTE  [name] ZOX:[id number] DOB: [birth year] DOA: 03/03/2014 PCP: Ginette Otto, MD  Assessment/Plan: 1. Septic shock 2. NSTEMI 3. HCAP 4. CKD stage 3 5. Dyspnea Discussed in detail with patient's daughter, patient is full comfort care. Will discontinue the antibiotics, start scopolamine patch. Continue with prn IV dilaudid. Will start Dilaudid infusion 0.5 mg/hr   Code Status: DNR Family Communication: *Discussed with family at bedside Disposition Plan: Anticipate hospital death   Consultants:  none  Procedures:  *None  Antibiotics:  *None  HPI/Subjective: Patient seen and examined, unresponsive. Have labored breathing.  Objective: Filed Vitals:   04-01-14 0921  BP: 99/51  Pulse: 61  Temp: 100.1 F (37.8 C)  Resp: 13    Intake/Output Summary (Last 24 hours) at 04/01/14 1255 Last data filed at 01-Apr-2014 8119  Gross per 24 hour  Intake    968 ml  Output    625 ml  Net    343 ml   Filed Weights   03/18/2014 1904 03/18/14 0020  Weight: 52.9 kg (116 lb 10 oz) 55.157 kg (121 lb 9.6 oz)    Exam:   General:  Unreponsive  Cardiovascular: S1s2 RRR  Respiratory: bilateral rhonchi  Abdomen: Soft, nontender   Data Reviewed: Basic Metabolic Panel:  Recent Labs Lab 03/12/2014 1935  NA 139  K 5.1  CL 102  CO2 15*  GLUCOSE 158*  BUN 62*  CREATININE 1.68*  CALCIUM 8.7   Liver Function Tests:  Recent Labs Lab 02/28/2014 1935  AST 112*  ALT 75*  ALKPHOS 113  BILITOT 2.4*  PROT 5.7*  ALBUMIN 2.5*    Recent Labs Lab 03/16/2014 1935  LIPASE 31   No results found for this basename: AMMONIA,  in the last 168 hours CBC:  Recent Labs Lab 03/10/2014 1935  WBC 21.4*  HGB 10.4*  HCT 31.5*  MCV 94.3  PLT 307   Cardiac Enzymes:  Recent Labs Lab 03/23/2014 1928  TROPONINI 7.97*   BNP (last 3 results)  Recent Labs  03/10/2014 1928  PROBNP 9427.0*   CBG: No results found for this basename: GLUCAP,  in  the last 168 hours  Recent Results (from the past 240 hour(s))  CULTURE, BLOOD (ROUTINE X 2)     Status: None   Collection Time    03/14/2014  7:35 PM      Result Value Ref Range Status   Specimen Description BLOOD RIGHT FOREARM   Final   Special Requests BOTTLES DRAWN AEROBIC AND ANAEROBIC 5CC   Final   Culture  Setup Time     Final   Value: 03/18/2014 00:31     Performed at [facility]   Culture     Final   Value:        BLOOD CULTURE RECEIVED NO GROWTH TO DATE CULTURE WILL BE HELD FOR 5 DAYS BEFORE ISSUING A FINAL NEGATIVE REPORT     Performed at [facility]   Report Status PENDING   Incomplete  CULTURE, BLOOD (ROUTINE X 2)     Status: None   Collection Time    03/08/2014  7:55 PM      Result Value Ref Range Status   Specimen Description BLOOD ARM RIGHT   Final   Special Requests BOTTLES DRAWN AEROBIC ONLY 10CC   Final   Culture  Setup Time     Final   Value: 03/18/2014 00:33     Performed at [facility]  Final   Value:        BLOOD CULTURE RECEIVED NO GROWTH TO DATE CULTURE WILL BE HELD FOR 5 DAYS BEFORE ISSUING A FINAL NEGATIVE REPORT     [facility]   Report Status PENDING   Incomplete  CLOSTRIDIUM DIFFICILE BY PCR     Status: None   Collection Time    03/18/14 12:08 AM      Result Value Ref Range Status   C difficile by pcr NEGATIVE  NEGATIVE Final     Studies: Dg Chest Portable 1 View  08/22/14   CLINICAL DATA:  Shortness of breath  EXAM: PORTABLE CHEST - 1 VIEW  COMPARISON:  03/07/2014  FINDINGS: Cardiac shadow is stable. Diffuse bilateral infiltrates are identified consistent with diffuse pneumonia. Findings of prior vertebral augmentation are is noted. No acute bony abnormality is seen.  IMPRESSION: Diffuse bilateral infiltrates.  Followup films are recommended.   Electronically Signed   By: Alcide CleverMark  Lukens M.D.   On: 008/30/15 20:24    Scheduled Meds: . scopolamine  1 patch Transdermal Q72H   Continuous  Infusions: . sodium chloride 10 mL/hr (03/18/14 0922)  . HYDROmorphone 0.5 mg/hr (03/18/2014 1052)    Principal Problem:   Septic shock Active Problems:   Atrial fibrillation   PPM-[employer]   Protein-calorie malnutrition, severe   CKD (chronic kidney disease) stage 3, GFR 30-59 ml/min   Acute respiratory failure   NSTEMI (non-ST elevated myocardial infarction)   HCAP (healthcare-associated pneumonia)   Respiratory failure    Time spent: 25 min    [facility] CenterAMA,Kjerstin Abrigo S  [facility] Pager [phone]. If 7PM-7AM, please contact night-coverage at [email or web address], password [email or web address] 02/22/2014, 12:55 PM  LOS: 2 days

## 2014-03-24 NOTE — Progress Notes (Signed)
In room Patient not breathing and no pupil reaction, no respirations ,no BP and no heart sounds  Called Dr. Sharl MaLama and he asked that we check the patient and pronounce death  Charge  nurse notified

## 2014-03-24 NOTE — Progress Notes (Signed)
Patients family wanted to change funeral homes and this information was given to  Bed Placement .

## 2014-03-24 DEATH — deceased

## 2014-03-30 LAB — CULTURE, BLOOD (ROUTINE X 2)

## 2014-06-04 ENCOUNTER — Ambulatory Visit: Payer: Medicare Other | Admitting: Podiatry

## 2014-07-20 ENCOUNTER — Ambulatory Visit: Payer: Medicare Other | Admitting: Vascular Surgery

## 2014-07-20 ENCOUNTER — Other Ambulatory Visit (HOSPITAL_COMMUNITY): Payer: Medicare Other
